# Patient Record
Sex: Female | Born: 1965 | Race: White | Hispanic: No | Marital: Married | Smoking: Former smoker
Health system: Southern US, Community
[De-identification: ages and names within clinical notes are randomized; demographics above are authoritative.]

## PROBLEM LIST (undated history)

## (undated) DIAGNOSIS — N920 Excessive and frequent menstruation with regular cycle: Secondary | ICD-10-CM

## (undated) DIAGNOSIS — R87619 Unspecified abnormal cytological findings in specimens from cervix uteri: Secondary | ICD-10-CM

## (undated) DIAGNOSIS — N946 Dysmenorrhea, unspecified: Secondary | ICD-10-CM

## (undated) DIAGNOSIS — E785 Hyperlipidemia, unspecified: Secondary | ICD-10-CM

## (undated) DIAGNOSIS — E039 Hypothyroidism, unspecified: Secondary | ICD-10-CM

## (undated) HISTORY — DX: Unspecified abnormal cytological findings in specimens from cervix uteri: R87.619

## (undated) HISTORY — DX: Excessive and frequent menstruation with regular cycle: N92.0

## (undated) HISTORY — DX: Hyperlipidemia, unspecified: E78.5

## (undated) HISTORY — DX: Hypothyroidism, unspecified: E03.9

## (undated) HISTORY — DX: Dysmenorrhea, unspecified: N94.6

---

## 2003-11-28 ENCOUNTER — Other Ambulatory Visit: Admission: RE | Admit: 2003-11-28 | Discharge: 2003-11-28 | Payer: Self-pay | Admitting: Gynecology

## 2004-01-10 ENCOUNTER — Ambulatory Visit (HOSPITAL_BASED_OUTPATIENT_CLINIC_OR_DEPARTMENT_OTHER): Admission: RE | Admit: 2004-01-10 | Discharge: 2004-01-10 | Payer: Self-pay | Admitting: Gynecology

## 2004-01-10 ENCOUNTER — Ambulatory Visit (HOSPITAL_COMMUNITY): Admission: RE | Admit: 2004-01-10 | Discharge: 2004-01-10 | Payer: Self-pay | Admitting: Gynecology

## 2004-07-05 HISTORY — PX: TUBAL LIGATION: SHX77

## 2005-07-21 ENCOUNTER — Other Ambulatory Visit: Admission: RE | Admit: 2005-07-21 | Discharge: 2005-07-21 | Payer: Self-pay | Admitting: Gynecology

## 2006-08-01 ENCOUNTER — Other Ambulatory Visit: Admission: RE | Admit: 2006-08-01 | Discharge: 2006-08-01 | Payer: Self-pay | Admitting: Gynecology

## 2008-10-08 ENCOUNTER — Ambulatory Visit: Payer: Self-pay | Admitting: Internal Medicine

## 2008-10-08 DIAGNOSIS — R5383 Other fatigue: Secondary | ICD-10-CM

## 2008-10-08 DIAGNOSIS — E785 Hyperlipidemia, unspecified: Secondary | ICD-10-CM | POA: Insufficient documentation

## 2008-10-08 DIAGNOSIS — E039 Hypothyroidism, unspecified: Secondary | ICD-10-CM

## 2008-10-08 DIAGNOSIS — R5381 Other malaise: Secondary | ICD-10-CM | POA: Insufficient documentation

## 2008-10-08 HISTORY — DX: Hyperlipidemia, unspecified: E78.5

## 2008-10-08 HISTORY — DX: Hypothyroidism, unspecified: E03.9

## 2008-10-08 LAB — CONVERTED CEMR LAB
AST: 23 units/L (ref 0–37)
Albumin: 3.9 g/dL (ref 3.5–5.2)
Alkaline Phosphatase: 58 units/L (ref 39–117)
Basophils Relative: 0.4 % (ref 0.0–3.0)
Bilirubin, Direct: 0.1 mg/dL (ref 0.0–0.3)
CO2: 32 meq/L (ref 19–32)
Calcium: 9.7 mg/dL (ref 8.4–10.5)
Cholesterol, target level: 200 mg/dL
Direct LDL: 132.6 mg/dL
Eosinophils Relative: 1.2 % (ref 0.0–5.0)
GFR calc non Af Amer: 82.63 mL/min (ref >60)
HDL: 62.1 mg/dL (ref >39.00)
Hemoglobin, Urine: NEGATIVE
Hemoglobin: 14.2 g/dL (ref 12.0–15.0)
Ketones, ur: NEGATIVE mg/dL
Lymphocytes Relative: 37.2 % (ref 12.0–46.0)
Monocytes Relative: 7.8 % (ref 3.0–12.0)
Neutro Abs: 3.7 10*3/uL (ref 1.4–7.7)
RBC: 4.48 M/uL (ref 3.87–5.11)
Sodium: 139 meq/L (ref 135–145)
Total CHOL/HDL Ratio: 3
Total Protein, Urine: NEGATIVE mg/dL
Total Protein: 6.7 g/dL (ref 6.0–8.3)
Triglycerides: 62 mg/dL (ref 0.0–149.0)
Urine Glucose: NEGATIVE mg/dL
Urobilinogen, UA: 0.2 (ref 0.0–1.0)
VLDL: 12.4 mg/dL (ref 0.0–40.0)
WBC: 6.9 10*3/uL (ref 4.5–10.5)

## 2008-10-09 ENCOUNTER — Encounter: Payer: Self-pay | Admitting: Internal Medicine

## 2008-11-12 ENCOUNTER — Ambulatory Visit: Payer: Self-pay | Admitting: Internal Medicine

## 2009-04-17 ENCOUNTER — Encounter: Payer: Self-pay | Admitting: Internal Medicine

## 2010-01-29 ENCOUNTER — Ambulatory Visit: Payer: Self-pay | Admitting: Internal Medicine

## 2010-01-29 LAB — CONVERTED CEMR LAB
AST: 20 units/L (ref 0–37)
Alkaline Phosphatase: 61 units/L (ref 39–117)
Basophils Absolute: 0 10*3/uL (ref 0.0–0.1)
Bilirubin, Direct: 0.1 mg/dL (ref 0.0–0.3)
CO2: 27 meq/L (ref 19–32)
Calcium: 8.8 mg/dL (ref 8.4–10.5)
Cholesterol: 201 mg/dL — ABNORMAL HIGH (ref 0–200)
Creatinine, Ser: 0.7 mg/dL (ref 0.4–1.2)
Eosinophils Absolute: 0.1 10*3/uL (ref 0.0–0.7)
GFR calc non Af Amer: 99.09 mL/min (ref >60)
Glucose, Bld: 86 mg/dL (ref 70–99)
HDL: 58.2 mg/dL (ref >39.00)
Hemoglobin: 14.8 g/dL (ref 12.0–15.0)
Lymphocytes Relative: 25.8 % (ref 12.0–46.0)
MCHC: 35.1 g/dL (ref 30.0–36.0)
Monocytes Relative: 9.1 % (ref 3.0–12.0)
Neutro Abs: 5.3 10*3/uL (ref 1.4–7.7)
Neutrophils Relative %: 63.8 % (ref 43.0–77.0)
Platelets: 273 10*3/uL (ref 150.0–400.0)
RDW: 12.1 % (ref 11.5–14.6)
Sodium: 139 meq/L (ref 135–145)
Total Bilirubin: 1 mg/dL (ref 0.3–1.2)
Triglycerides: 59 mg/dL (ref 0.0–149.0)

## 2010-01-30 ENCOUNTER — Encounter: Payer: Self-pay | Admitting: Internal Medicine

## 2010-02-09 ENCOUNTER — Telehealth: Payer: Self-pay | Admitting: Internal Medicine

## 2010-08-04 NOTE — Letter (Signed)
Summary: Lipid Letter  [facility]  [address] [place], [place] [address]   Phone: [phone]  Fax: [phone]    01/30/2010  Melissa Gibson [address] [place], [place]  [address]  Dear Corrie Dandy:  We have carefully reviewed your last lipid profile from  and the results are noted below with a summary of recommendations for lipid management.    Cholesterol:       201     Goal: <200   HDL "good" Cholesterol:   40.10     Goal: >40   LDL "bad" Cholesterol:   125     Goal: <160   Triglycerides:       59.0     Goal: <150        TLC Diet (Therapeutic Lifestyle Change): Saturated Fats & Transfatty acids should be kept < 7% of total calories ***Reduce Saturated Fats Polyunstaurated Fat can be up to 10% of total calories Monounsaturated Fat Fat can be up to 20% of total calories Total Fat should be no greater than 25-35% of total calories Carbohydrates should be 50-60% of total calories Protein should be approximately 15% of total calories Fiber should be at least 20-30 grams a day ***Increased fiber may help lower LDL Total Cholesterol should be < 200mg /day Consider adding plant stanol/sterols to diet (example: Benacol spread) ***A higher intake of unsaturated fat may reduce Triglycerides and Increase HDL    Adjunctive Measures (may lower LIPIDS and reduce risk of Heart Attack) include: Aerobic Exercise (20-30 minutes 3-4 times a week) Limit Alcohol Consumption Weight Reduction Aspirin 75-81 mg a day by mouth (if not allergic or contraindicated) Dietary Fiber 20-30 grams a day by mouth     Current Medications:  None If you have any questions, please call. We appreciate being able to work with you.   Sincerely,    [facility] Etta Grandchild MD

## 2010-08-04 NOTE — Assessment & Plan Note (Signed)
Summary: CPX/ WILL COME FASTING/ NWS  #   Vital Signs:  Patient profile:   45 year old female Height:      64 inches Weight:      162.19 pounds BMI:     27.94 O2 Sat:      97 % on Room air Temp:     98.7 degrees F oral Pulse rate:   79 / minute Pulse rhythm:   regular Resp:     16 per minute BP sitting:   112 / 72  (left arm) Cuff size:   large  Vitals Entered By: Rock Nephew CMA (January 29, 2010 8:49 AM)  Nutrition Counseling: Patient's BMI is greater than 25 and therefore counseled on weight management options.  O2 Flow:  Room air  Primary Care Provider:  Etta Grandchild MD   History of Present Illness: She returns for f/up.  Preventive Screening-Counseling & Management  Alcohol-Tobacco     Alcohol drinks/day: <1     Alcohol type: wine     >5/day in last 3 mos: no     Alcohol Counseling: not indicated; use of alcohol is not excessive or problematic     Feels need to cut down: no     Feels annoyed by complaints: no     Feels guilty re: drinking: no     Needs 'eye opener' in am: no     Smoking Status: quit     Smoke Cessation Stage: quit     Year Started: 1987     Year Quit: 2009     Pack years: 15     Tobacco Counseling: to remain off tobacco products  Hep-HIV-STD-Contraception     Hepatitis Risk: no risk noted     HIV Risk: no risk noted     STD Risk: no risk noted     Sun Exposure-Excessive: no  Current Medications (verified): 1)  None  Allergies (verified): No Known Drug Allergies  Past History:  Past Medical History: Last updated: 10/08/2008 Hyperlipidemia Hypothyroidism  Past Surgical History: Last updated: 10/08/2008 Tubal ligation  Family History: Last updated: 10/08/2008 Family History of CAD Female 1st degree relative <50 Family History High cholesterol Family History Hypertension Family History Lung cancer Family History of Sudden Death  Social History: Last updated: 10/08/2008 Occupation: Airline pilot Married Drug use-no Regular  exercise-yes  Risk Factors: Alcohol Use: <1 (01/29/2010) >5 drinks/d w/in last 3 months: no (01/29/2010) Exercise: yes (10/08/2008)  Risk Factors: Smoking Status: quit (01/29/2010)  Family History: Reviewed history from 10/08/2008 and no changes required. Family History of CAD Female 1st degree relative <50 Family History High cholesterol Family History Hypertension Family History Lung cancer Family History of Sudden Death  Social History: Reviewed history from 10/08/2008 and no changes required. Occupation: Sales Married Drug use-no Regular exercise-yes  Review of Systems       The patient complains of weight gain.  The patient denies anorexia, fever, weight loss, chest pain, syncope, dyspnea on exertion, peripheral edema, prolonged cough, headaches, hemoptysis, abdominal pain, melena, hematochezia, severe indigestion/heartburn, hematuria, suspicious skin lesions, difficulty walking, depression, enlarged lymph nodes, and angioedema.   General:  Complains of fatigue; denies chills, loss of appetite, malaise, sleep disorder, sweats, and weakness. GI:  Complains of constipation; denies abdominal pain, bloody stools, diarrhea, excessive appetite, indigestion, loss of appetite, nausea, vomiting, vomiting blood, and yellowish skin color. Endo:  Complains of weight change; denies cold intolerance, excessive hunger, excessive thirst, excessive urination, heat intolerance, and polyuria.  Physical Exam  General:  alert, well-developed, well-nourished, and well-hydrated.   Head:  Normocephalic and atraumatic without obvious abnormalities. No apparent alopecia or balding. Eyes:  No corneal or conjunctival inflammation noted. EOMI. Perrla. Funduscopic exam benign, without hemorrhages, exudates or papilledema. Vision grossly normal. Mouth:  Oral mucosa and oropharynx without lesions or exudates.  Teeth in good repair. Neck:  supple, full ROM, and no masses.   Lungs:  Normal respiratory  effort, chest expands symmetrically. Lungs are clear to auscultation, no crackles or wheezes. Heart:  Normal rate and regular rhythm. S1 and S2 normal without gallop, murmur, click, rub or other extra sounds. Abdomen:  soft, non-tender, normal bowel sounds, no hepatomegaly, and no splenomegaly.   Msk:  normal ROM, no joint tenderness, no joint swelling, no joint warmth, no redness over joints, no joint deformities, no joint instability, and no crepitation.   Pulses:  R and L carotid,radial,femoral,dorsalis pedis and posterior tibial pulses are full and equal bilaterally Extremities:  No clubbing, cyanosis, edema, or deformity noted with normal full range of motion of all joints.   Neurologic:  No cranial nerve deficits noted. Station and gait are normal. Plantar reflexes are down-going bilaterally. DTRs are symmetrical throughout. Sensory, motor and coordinative functions appear intact. Skin:  turgor normal, color normal, no rashes, no suspicious lesions, no ecchymoses, no petechiae, no purpura, no ulcerations, and no edema.   Cervical Nodes:  No lymphadenopathy noted Axillary Nodes:  No palpable lymphadenopathy Inguinal Nodes:  no R inguinal adenopathy and no L inguinal adenopathy.   Psych:  Oriented X3, memory intact for recent and remote, normally interactive, good eye contact, not anxious appearing, not depressed appearing, and not agitated.     Impression & Recommendations:  Problem # 1:  HYPOTHYROIDISM (ICD-244.9) Assessment Unchanged  Orders: Venipuncture (98119) TLB-Lipid Panel (80061-LIPID) TLB-BMP (Basic Metabolic Panel-BMET) (80048-METABOL) TLB-CBC Platelet - w/Differential (85025-CBCD) TLB-Hepatic/Liver Function Pnl (80076-HEPATIC) TLB-TSH (Thyroid Stimulating Hormone) (84443-TSH)  Problem # 2:  HYPERLIPIDEMIA (ICD-272.4) Assessment: Unchanged  Orders: Venipuncture (14782) TLB-Lipid Panel (80061-LIPID) TLB-BMP (Basic Metabolic Panel-BMET) (80048-METABOL) TLB-CBC Platelet  - w/Differential (85025-CBCD) TLB-Hepatic/Liver Function Pnl (80076-HEPATIC) TLB-TSH (Thyroid Stimulating Hormone) (84443-TSH)  Labs Reviewed: SGOT: 23 (10/08/2008)   SGPT: 24 (10/08/2008)  Lipid Goals: Chol Goal: 200 (10/08/2008)   HDL Goal: 40 (10/08/2008)   LDL Goal: 160 (10/08/2008)   TG Goal: 150 (10/08/2008)  Prior 10 Yr Risk Heart Disease: Not enough information (10/08/2008)   HDL:62.10 (10/08/2008)  Chol:205 (10/08/2008)  Trig:62.0 (10/08/2008)  Problem # 3:  FATIGUE, ACUTE (ICD-780.79) Assessment: Unchanged  Orders: Venipuncture (95621) TLB-Lipid Panel (80061-LIPID) TLB-BMP (Basic Metabolic Panel-BMET) (80048-METABOL) TLB-CBC Platelet - w/Differential (85025-CBCD) TLB-Hepatic/Liver Function Pnl (80076-HEPATIC) TLB-TSH (Thyroid Stimulating Hormone) (84443-TSH)  PAP Screening:    Hx Cervical Dysplasia in last 5 yrs? No    3 normal PAP smears in last 5 yrs? Yes    Last PAP smear:  04/22/2009  PAP Smear Results:    Date of Exam:  04/22/2009    Results:  Normal  Mammogram Screening:    Last Mammogram:  12/25/2009  Mammogram Results:    Date of Exam:  12/25/2009    Results:  Normal Bilateral  Osteoporosis Risk Assessment:  Risk Factors for Fracture or Low Bone Density:   Race (White or Asian):     yes   Smoking status:       quit   Thyroid disease:     yes  Patient Instructions: 1)  Please schedule a follow-up appointment in 2 months. 2)  It is important that you exercise  regularly at least 20 minutes 5 times a week. If you develop chest pain, have severe difficulty breathing, or feel very tired , stop exercising immediately and seek medical attention. 3)  You need to lose weight. Consider a lower calorie diet and regular exercise.  4)  Schedule your mammogram. 5)  You need to have a Pap Smear to prevent cervical cancer.  Preventive Care Screening  Mammogram:    Date:  12/03/2009    Results:  normal     Not Administered:    Tetanus Vaccine not given  due to: declined

## 2010-08-04 NOTE — Progress Notes (Signed)
Summary: RESULTS  Phone Note Call from Patient Call back at Work Phone [phone] Call back at [facility] ext [phone] till 4pm   Summary of Call: Patient is requesting results of thyroid. Ok to inform it was normal?  Initial call taken by: Lamar Sprinkles, CMA,  February 09, 2010 10:45 AM  Follow-up for Phone Call        yes Follow-up by: Etta Grandchild MD,  February 09, 2010 10:49 AM  Additional Follow-up for Phone Call Additional follow up Details #1::        Pt at lunch, will try again later. ............Marland KitchenLamar Sprinkles, CMA  February 09, 2010 12:10 PM   Pt informed  Additional Follow-up by: Lamar Sprinkles, CMA,  February 09, 2010 2:26 PM

## 2010-10-05 DIAGNOSIS — E039 Hypothyroidism, unspecified: Secondary | ICD-10-CM | POA: Insufficient documentation

## 2010-10-05 HISTORY — DX: Hypothyroidism, unspecified: E03.9

## 2010-11-03 HISTORY — PX: NOVASURE ABLATION: SHX5394

## 2010-11-20 NOTE — Op Note (Signed)
NAMESPRUHA, WEIGHT                           ACCOUNT NO.:  [id number]   MEDICAL RECORD NO.:  [id number]                   PATIENT TYPE:  AMB   LOCATION:  [facility]                                 FACILITY:  [facility]   PHYSICIAN:  Gretta Cool, M.D.              DATE OF BIRTH:  03/09/1966   DATE OF PROCEDURE:  DATE OF DISCHARGE:                                 OPERATIVE REPORT   PREOPERATIVE DIAGNOSIS:  Desires sterilization.   POSTOPERATIVE DIAGNOSIS:  Desires sterilization plus minimal endometriosis.   SURGEON:  Gretta Cool, M.D.   ANESTHESIA:  IV sedation and local anesthesia.   DESCRIPTION OF PROCEDURE:  Under excellent anesthesia as above with the  patient prepped and draped in Allen stirrups.  With a Hulka tenaculum  applied to her cervix, a subumbilical incision was made, and Veress cannula  introduced.  After adequate pneumoperitoneum, the laparoscopic trocar was  introduced and pelvic organs visualized.  Nitrous oxide was used for  insufflation.  The fallopian tubes were examined carefully.  The entire  posterior aspect of the uterus was examined.  There was some minimal  adhesion and minimal endometriosis in the depth of the cul de sac.  The  patient had some small amount of menstrual-looking blood in her peritoneal  cavity immediately post menstrual.  There was no involvement of ovaries,  tubes, or any other than tiny implants in the depth of the pelvis.  At this  point, the Filshie clips were loaded and then applied across the entire  lumen of fallopian tube on both sides, approximately 1 cm or so distal to  the uterine insertion of the fallopian tube.  At this point, the fallopian  tubes were re-anesthetized topically with Marcaine 0.5%.  The port sites  were also infiltrated heavily with Marcaine 0.5%.  The incision was then  closed with a deep suture of 0 Vicryl at the 10 mm port site using UR6  needle.  The skin incision was then closed with subcuticular 5-0  Vicryl and  Steri-Strips.  At the end of the procedure, sponge and lap counts were  correct and no complications.  The patient returned to the recovery room in  excellent condition.                                               Gretta Cool, M.D.    CWL/MEDQ  D:  01/10/2004  T:  01/10/2004  Job:  [id number]

## 2011-03-16 ENCOUNTER — Encounter: Payer: Self-pay | Admitting: Internal Medicine

## 2011-03-16 ENCOUNTER — Encounter: Payer: Self-pay | Admitting: *Deleted

## 2011-03-18 ENCOUNTER — Ambulatory Visit: Payer: Self-pay | Admitting: Internal Medicine

## 2011-03-26 ENCOUNTER — Encounter: Payer: Self-pay | Admitting: Internal Medicine

## 2011-03-26 ENCOUNTER — Ambulatory Visit (INDEPENDENT_AMBULATORY_CARE_PROVIDER_SITE_OTHER)
Admission: RE | Admit: 2011-03-26 | Discharge: 2011-03-26 | Disposition: A | Discharge status: Home / Self Care | Payer: BC Managed Care – PPO | Source: Ambulatory Visit | Attending: Internal Medicine | Admitting: Internal Medicine

## 2011-03-26 ENCOUNTER — Ambulatory Visit (INDEPENDENT_AMBULATORY_CARE_PROVIDER_SITE_OTHER): Payer: BC Managed Care – PPO | Admitting: Internal Medicine

## 2011-03-26 VITALS — BP 128/84 | HR 73 | Temp 98.8°F | Resp 16 | Wt 167.5 lb

## 2011-03-26 DIAGNOSIS — M25512 Pain in left shoulder: Secondary | ICD-10-CM | POA: Insufficient documentation

## 2011-03-26 DIAGNOSIS — M67919 Unspecified disorder of synovium and tendon, unspecified shoulder: Secondary | ICD-10-CM

## 2011-03-26 DIAGNOSIS — M25519 Pain in unspecified shoulder: Secondary | ICD-10-CM

## 2011-03-26 DIAGNOSIS — M7580 Other shoulder lesions, unspecified shoulder: Secondary | ICD-10-CM | POA: Insufficient documentation

## 2011-03-26 MED ORDER — CELECOXIB 200 MG PO CAPS: 200.0000 mg | ORAL_CAPSULE | Freq: Every day | ORAL | Status: DC

## 2011-03-26 NOTE — Patient Instructions (Signed)
Rotator Cuff Tendonitis (Tendinitis, Tenosynovitis) The rotator cuff is the collection of all the muscles and tendons (the supraspinatus, infraspinatus, subscapularis, and teres minor muscles and their tendons) that help your shoulder stay in place. This unit holds the head of the upper arm bone (humerus) in the cup (fossa) of the shoulder blade (scapula). Basically, it connects the arm to the shoulder. Tendinitis is a swelling and irritation of the tissue, called cord like structures (tendons) that connect muscle to bone. It usually is caused by overusing the joint involved. When the tissue surrounding a tendon (the synovium) becomes inflamed, it is called tenosynovitis. This also is often the result of overuse in people whose jobs require repetitive (over and over again) types of motion. HOME CARE INSTRUCTIONS  Use a sling or splint for   until the pain decreases, or as suggested by your caregiver.   Apply ice to the injury for 20 minutes, 2 times per day. Put the ice in a plastic bag and place a towel between the bag of ice and your skin.   Try to avoid use other than gentle range of motion while your shoulder is painful. Use and exercise only as directed by your caregiver. Stop exercises or range of motion if pain or discomfort increases, unless directed otherwise by your caregiver.   Only take over-the-counter or prescription medicines for pain, discomfort, or fever as directed by your caregiver.   If you were give a shoulder sling and straps (immobilizer), do not remove it except as directed, or until you see a caregiver for a follow-up examination. If you need to remove it, move your arm as little as possible or as directed.   You may want to sleep on several pillows at night to lessen swelling and pain.  SEEK IMMEDIATE MEDICAL CARE IF:  Pain in your shoulder increases or new pain develops in your arm, hand, or fingers and is not relieved with medications.   You develop new, unexplained  symptoms, especially increased numbness in the hands or loss of strength, or you develop any worsening of the problems which brought you in for care.   Your arm, hand, or fingers are numb or tingling.   Your arm, hand, or fingers are swollen, painful, or turn white or blue.  Document Released: 09/11/2003 Document Re-Released: 09/17/2008 [facility] Patient Information 2011 [place], [place].

## 2011-03-26 NOTE — Assessment & Plan Note (Signed)
Ice, rest, celebres, pt ed, consider PT

## 2011-03-26 NOTE — Assessment & Plan Note (Signed)
Check a plain xray to look for spurring, djd, etc.

## 2011-03-26 NOTE — Progress Notes (Signed)
  Subjective:    Patient ID: Melissa Gibson, female    DOB: 11-05-1965, 45 y.o.   MRN: [id number]  Shoulder Pain  The pain is present in the left shoulder. This is a new problem. The current episode started 1 to 4 weeks ago. There has been no history of extremity trauma. The problem occurs intermittently. The problem has been gradually worsening. The quality of the pain is described as sharp. The pain is at a severity of 3/10. The pain is mild. Pertinent negatives include no joint locking, joint swelling, limited range of motion, numbness, stiffness or tingling. The symptoms are aggravated by activity. She has tried acetaminophen for the symptoms. The treatment provided mild relief. There is no history of diabetes, gout, osteoarthritis or rheumatoid arthritis.      Review of Systems  Constitutional: Negative.   HENT: Negative for neck pain and neck stiffness.   Eyes: Negative.   Respiratory: Negative.   Cardiovascular: Negative.   Gastrointestinal: Negative.   Genitourinary: Negative.   Musculoskeletal: Positive for arthralgias. Negative for myalgias, back pain, joint swelling, gait problem, stiffness and gout.  Skin: Negative.   Neurological: Negative.  Negative for tingling and numbness.  Hematological: Negative.   Psychiatric/Behavioral: Negative.        Objective:   Physical Exam  Vitals reviewed. Constitutional: She appears well-developed and well-nourished. No distress.  HENT:  Mouth/Throat: Oropharynx is clear and moist. No oropharyngeal exudate.  Eyes: Conjunctivae are normal. Right eye exhibits no discharge. Left eye exhibits no discharge. No scleral icterus.  Neck: Normal range of motion. Neck supple. No JVD present. No tracheal deviation present. No thyromegaly present.  Cardiovascular: Normal rate, regular rhythm, normal heart sounds and intact distal pulses.  Exam reveals no gallop and no friction rub.   No murmur heard. Pulmonary/Chest: Effort normal and breath sounds  normal. No stridor. No respiratory distress. She has no wheezes. She has no rales. She exhibits no tenderness.  Abdominal: Soft. Bowel sounds are normal. She exhibits no distension. There is no tenderness. There is no rebound and no guarding.  Musculoskeletal:       Left shoulder: She exhibits tenderness (over the posterior rotator cuff area). She exhibits normal range of motion, no bony tenderness, no swelling, no effusion, no crepitus, no deformity, no laceration, no pain, no spasm, normal pulse and normal strength.  Lymphadenopathy:    She has no cervical adenopathy.  Skin: She is not diaphoretic.      Lab Results  Component Value Date   WBC 8.3 01/29/2010   HGB 14.8 01/29/2010   HCT 42.2 01/29/2010   PLT 273.0 01/29/2010   CHOL 201* 01/29/2010   TRIG 59.0 01/29/2010   HDL 58.20 01/29/2010   LDLDIRECT 125.0 01/29/2010   ALT 27 01/29/2010   AST 20 01/29/2010   NA 139 01/29/2010   K 4.9 01/29/2010   CL 98 01/29/2010   CREATININE 0.7 01/29/2010   BUN 18 01/29/2010   CO2 27 01/29/2010   TSH 4.20 01/29/2010      Assessment & Plan:

## 2011-06-04 ENCOUNTER — Other Ambulatory Visit (INDEPENDENT_AMBULATORY_CARE_PROVIDER_SITE_OTHER): Payer: BC Managed Care – PPO

## 2011-06-04 ENCOUNTER — Encounter: Payer: Self-pay | Admitting: Internal Medicine

## 2011-06-04 ENCOUNTER — Ambulatory Visit (INDEPENDENT_AMBULATORY_CARE_PROVIDER_SITE_OTHER): Payer: BC Managed Care – PPO | Admitting: Internal Medicine

## 2011-06-04 DIAGNOSIS — M25512 Pain in left shoulder: Secondary | ICD-10-CM

## 2011-06-04 DIAGNOSIS — E039 Hypothyroidism, unspecified: Secondary | ICD-10-CM

## 2011-06-04 DIAGNOSIS — M67919 Unspecified disorder of synovium and tendon, unspecified shoulder: Secondary | ICD-10-CM

## 2011-06-04 DIAGNOSIS — M25519 Pain in unspecified shoulder: Secondary | ICD-10-CM

## 2011-06-04 DIAGNOSIS — M7580 Other shoulder lesions, unspecified shoulder: Secondary | ICD-10-CM

## 2011-06-04 LAB — TSH: TSH: 1.49 u[IU]/mL (ref 0.35–5.50)

## 2011-06-04 NOTE — Patient Instructions (Signed)
Hypothyroidism The thyroid is a large gland located in the lower front of your neck. The thyroid gland helps control metabolism. Metabolism is how your body handles food. It controls metabolism with the hormone thyroxine. When this gland is underactive (hypothyroid), it produces too little hormone.  CAUSES These include:   Absence or destruction of thyroid tissue.   Goiter due to iodine deficiency.   Goiter due to medications.   Congenital defects (since birth).   Problems with the pituitary. This causes a lack of TSH (thyroid stimulating hormone). This hormone tells the thyroid to turn out more hormone.  SYMPTOMS  Lethargy (feeling as though you have no energy)   Cold intolerance   Weight gain (in spite of normal food intake)   Dry skin   Coarse hair   Menstrual irregularity (if severe, may lead to infertility)   Slowing of thought processes  Cardiac problems are also caused by insufficient amounts of thyroid hormone. Hypothyroidism in the newborn is cretinism, and is an extreme form. It is important that this form be treated adequately and immediately or it will lead rapidly to retarded physical and mental development. DIAGNOSIS  To prove hypothyroidism, your caregiver may do blood tests and ultrasound tests. Sometimes the signs are hidden. It may be necessary for your caregiver to watch this illness with blood tests either before or after diagnosis and treatment. TREATMENT  Low levels of thyroid hormone are increased by using synthetic thyroid hormone. This is a safe, effective treatment. It usually takes about four weeks to gain the full effects of the medication. After you have the full effect of the medication, it will generally take another four weeks for problems to leave. Your caregiver may start you on low doses. If you have had heart problems the dose may be gradually increased. It is generally not an emergency to get rapidly to normal. HOME CARE INSTRUCTIONS   Take  your medications as your caregiver suggests. Let your caregiver know of any medications you are taking or start taking. Your caregiver will help you with dosage schedules.   As your condition improves, your dosage needs may increase. It will be necessary to have continuing blood tests as suggested by your caregiver.   Report all suspected medication side effects to your caregiver.  SEEK MEDICAL CARE IF: Seek medical care if you develop:  Sweating.   Tremulousness (tremors).   Anxiety.   Rapid weight loss.   Heat intolerance.   Emotional swings.   Diarrhea.   Weakness.  SEEK IMMEDIATE MEDICAL CARE IF:  You develop chest pain, an irregular heart beat (palpitations), or a rapid heart beat. MAKE SURE YOU:   Understand these instructions.   Will watch your condition.   Will get help right away if you are not doing well or get worse.  Document Released: 06/21/2005 Document Revised: 03/03/2011 Document Reviewed: 02/09/2008 [facility] Patient Information 2012 [place], [place].Rotator Cuff Tendonitis  The rotator cuff is the collection of all the muscles and tendons (the supraspinatus, infraspinatus, subscapularis, and teres minor muscles and their tendons) that help your shoulder stay in place. This unit holds the head of the upper arm bone (humerus) in the cup (fossa) of the shoulder blade (scapula). Basically, it connects the arm to the shoulder. Tendinitis is a swelling and irritation of the tissue, called cord like structures (tendons) that connect muscle to bone. It usually is caused by overusing the joint involved. When the tissue surrounding a tendon (the synovium) becomes inflamed, it is called tenosynovitis.  This also is often the result of overuse in people whose jobs require repetitive (over and over again) types of motion. HOME CARE INSTRUCTIONS   Use a sling or splint for as long as directed by your caregiver until the pain decreases.   Apply ice to the injury for 15 to 20  minutes, 3 to 4 times per day. Put the ice in a plastic bag and place a towel between the bag of ice and your skin.   Try to avoid use other than gentle range of motion while your shoulder is painful. Use and exercise only as directed by your caregiver. Stop exercises or range of motion if pain or discomfort increases, unless directed otherwise by your caregiver.   Only take over-the-counter or prescription medicines for pain, discomfort, or fever as directed by your caregiver.   If you were give a shoulder sling and straps (immobilizer), do not remove it except as directed, or until you see a caregiver for a follow-up examination. If you need to remove it, move your arm as little as possible or as directed.   You may want to sleep on several pillows at night to lessen swelling and pain.  SEEK IMMEDIATE MEDICAL CARE IF:   Pain in your shoulder increases or new pain develops in your arm, hand, or fingers and is not relieved with medications.   You develop new, unexplained symptoms, especially increased numbness in the hands or loss of strength, or you develop any worsening of the problems which brought you in for care.   Your arm, hand, or fingers are numb or tingling.   Your arm, hand, or fingers are swollen, painful, or turn white or blue.  Document Released: 09/11/2003 Document Revised: 03/03/2011 Document Reviewed: 04/18/2008 [facility] Patient Information 2012 [place], [place].

## 2011-06-04 NOTE — Progress Notes (Deleted)
Subjective:    Patient ID: Melissa Gibson, female    DOB: 1966-05-19, 45 y.o.   MRN: [id number]  Cough This is a new problem. Episode onset: 10 days. The problem has been gradually worsening. The problem occurs every few minutes. The cough is productive of purulent sputum. Associated symptoms include chills and a sore throat. Pertinent negatives include no chest pain, ear congestion, ear pain, eye redness, fever, headaches, heartburn, hemoptysis, myalgias, nasal congestion, postnasal drip, rash, rhinorrhea, shortness of breath, sweats, weight loss or wheezing. The symptoms are aggravated by nothing. She has tried OTC cough suppressant for the symptoms. The treatment provided no relief.  Neck Pain  This is a recurrent problem. The current episode started more than 1 month ago. The problem occurs intermittently. The problem has been gradually worsening. The pain is associated with nothing. The pain is present in the right side and midline. The quality of the pain is described as aching and shooting. The pain is at a severity of 2/10. The pain is mild. The symptoms are aggravated by position. Stiffness is present all day. Pertinent negatives include no chest pain, fever, headaches, leg pain, numbness, pain with swallowing, paresis, photophobia, syncope, tingling, trouble swallowing, visual change, weakness or weight loss. She has tried nothing for the symptoms.      Review of Systems  Constitutional: Positive for chills. Negative for fever, weight loss, diaphoresis, activity change, appetite change, fatigue and unexpected weight change.  HENT: Positive for sore throat, neck pain and neck stiffness. Negative for ear pain, congestion, facial swelling, rhinorrhea, sneezing, trouble swallowing, dental problem, voice change, postnasal drip and sinus pressure.   Eyes: Negative for photophobia, pain, discharge, redness, itching and visual disturbance.  Respiratory: Positive for cough. Negative for apnea,  hemoptysis, choking, chest tightness, shortness of breath, wheezing and stridor.   Cardiovascular: Negative for chest pain, palpitations, leg swelling and syncope.  Gastrointestinal: Negative for heartburn, nausea, vomiting, abdominal pain, diarrhea, constipation, blood in stool and abdominal distention.  Genitourinary: Negative for dysuria, urgency, frequency, hematuria, flank pain, decreased urine volume, enuresis, difficulty urinating and dyspareunia.  Musculoskeletal: Negative for myalgias, back pain, joint swelling, arthralgias and gait problem.  Skin: Negative for color change, pallor, rash and wound.  Neurological: Negative for dizziness, tingling, tremors, seizures, syncope, facial asymmetry, speech difficulty, weakness, light-headedness, numbness and headaches.  Hematological: Negative for adenopathy. Does not bruise/bleed easily.  Psychiatric/Behavioral: Negative.        Objective:   Physical Exam  Vitals reviewed. Constitutional: She is oriented to person, place, and time. She appears well-developed and well-nourished. No distress.  HENT:  Head: Normocephalic and atraumatic.  Mouth/Throat: Oropharynx is clear and moist. No oropharyngeal exudate.  Eyes: Conjunctivae are normal. Right eye exhibits no discharge. Left eye exhibits no discharge. No scleral icterus.  Neck: Normal range of motion. Neck supple. No JVD present. No tracheal deviation present. No thyromegaly present.  Cardiovascular: Normal rate, regular rhythm, normal heart sounds and intact distal pulses.  Exam reveals no gallop and no friction rub.   No murmur heard. Pulmonary/Chest: Effort normal and breath sounds normal. No stridor. No respiratory distress. She has no wheezes. She has no rales. She exhibits no tenderness.  Abdominal: Soft. Bowel sounds are normal. She exhibits no distension and no mass. There is no tenderness. There is no rebound and no guarding.  Musculoskeletal: Normal range of motion. She exhibits no  edema.       Cervical back: Normal. She exhibits normal range of motion, no tenderness, no  bony tenderness, no swelling, no edema, no deformity, no laceration, no pain, no spasm and normal pulse.  Lymphadenopathy:    She has no cervical adenopathy.  Neurological: She is alert and oriented to person, place, and time. She has normal strength. She displays no atrophy, no tremor and normal reflexes. No cranial nerve deficit or sensory deficit. She exhibits normal muscle tone. She displays a negative Romberg sign. She displays no seizure activity. Coordination and gait normal. She displays no Babinski's sign on the right side. She displays no Babinski's sign on the left side.  Reflex Scores:      Tricep reflexes are 1+ on the right side and 1+ on the left side.      Bicep reflexes are 1+ on the right side and 1+ on the left side.      Brachioradialis reflexes are 1+ on the right side.      Patellar reflexes are 1+ on the right side and 1+ on the left side.      Achilles reflexes are 1+ on the right side and 1+ on the left side. Skin: Skin is warm and dry. No rash noted. She is not diaphoretic. No erythema. No pallor.  Psychiatric: She has a normal mood and affect. Her behavior is normal. Judgment and thought content normal.      Lab Results  Component Value Date   WBC 8.3 01/29/2010   HGB 14.8 01/29/2010   HCT 42.2 01/29/2010   PLT 273.0 01/29/2010   GLUCOSE 86 01/29/2010   CHOL 201* 01/29/2010   TRIG 59.0 01/29/2010   HDL 58.20 01/29/2010   LDLDIRECT 125.0 01/29/2010   ALT 27 01/29/2010   AST 20 01/29/2010   NA 139 01/29/2010   K 4.9 01/29/2010   CL 98 01/29/2010   CREATININE 0.7 01/29/2010   BUN 18 01/29/2010   CO2 27 01/29/2010   TSH 4.20 01/29/2010      Assessment & Plan:

## 2011-06-06 ENCOUNTER — Encounter: Payer: Self-pay | Admitting: Internal Medicine

## 2011-06-06 NOTE — Assessment & Plan Note (Signed)
Ortho referral, I question the need for an MRI, steroid injection, etc.

## 2011-06-06 NOTE — Progress Notes (Signed)
  Subjective:    Patient ID: Melissa Gibson, female    DOB: Apr 15, 1966, 45 y.o.   MRN: [id number]  Thyroid Problem Presents for follow-up visit. Patient reports no anxiety, cold intolerance, constipation, depressed mood, diaphoresis, diarrhea, dry skin, fatigue, hair loss, heat intolerance, hoarse voice, leg swelling, menstrual problem, nail problem, palpitations, tremors, visual change, weight gain or weight loss. The symptoms have been stable.      Review of Systems  Constitutional: Negative for fever, chills, weight loss, weight gain, diaphoresis, activity change, appetite change, fatigue and unexpected weight change.  HENT: Negative.  Negative for hoarse voice.   Eyes: Negative.   Respiratory: Negative for apnea, cough, choking, shortness of breath, wheezing and stridor.   Cardiovascular: Negative for chest pain, palpitations and leg swelling.  Gastrointestinal: Negative for nausea, vomiting, abdominal pain, diarrhea, constipation and blood in stool.  Genitourinary: Negative.  Negative for menstrual problem.  Musculoskeletal: Positive for arthralgias (worsening left shoulder pain). Negative for myalgias, back pain, joint swelling and gait problem.  Skin: Negative for color change, pallor, rash and wound.  Neurological: Negative for dizziness, tremors, seizures, syncope, facial asymmetry, speech difficulty, weakness, light-headedness, numbness and headaches.  Hematological: Negative for cold intolerance, heat intolerance and adenopathy. Does not bruise/bleed easily.  Psychiatric/Behavioral: Negative.        Objective:   Physical Exam  Vitals reviewed. Constitutional: She is oriented to person, place, and time. She appears well-developed and well-nourished. No distress.  HENT:  Head: Normocephalic and atraumatic.  Mouth/Throat: Oropharynx is clear and moist. No oropharyngeal exudate.  Eyes: Conjunctivae are normal. Right eye exhibits no discharge. Left eye exhibits no discharge. No  scleral icterus.  Neck: Normal range of motion. Neck supple. No JVD present. No tracheal deviation present. No thyromegaly present.  Cardiovascular: Normal rate, regular rhythm, normal heart sounds and intact distal pulses.  Exam reveals no gallop and no friction rub.   No murmur heard. Pulmonary/Chest: Effort normal and breath sounds normal. No stridor. No respiratory distress. She has no wheezes. She has no rales. She exhibits no tenderness.  Abdominal: Soft. Bowel sounds are normal. She exhibits no distension and no mass. There is no tenderness. There is no rebound and no guarding.  Musculoskeletal: She exhibits no edema and no tenderness.       Left shoulder: She exhibits decreased range of motion. She exhibits no tenderness, no bony tenderness, no swelling, no effusion, no crepitus, no deformity, no laceration, no pain, no spasm, normal pulse and normal strength.  Lymphadenopathy:    She has no cervical adenopathy.  Neurological: She is oriented to person, place, and time.  Skin: Skin is warm and dry. No rash noted. She is not diaphoretic. No erythema. No pallor.  Psychiatric: She has a normal mood and affect. Her behavior is normal. Judgment and thought content normal.      Lab Results  Component Value Date   WBC 8.3 01/29/2010   HGB 14.8 01/29/2010   HCT 42.2 01/29/2010   PLT 273.0 01/29/2010   GLUCOSE 86 01/29/2010   CHOL 201* 01/29/2010   TRIG 59.0 01/29/2010   HDL 58.20 01/29/2010   LDLDIRECT 125.0 01/29/2010   ALT 27 01/29/2010   AST 20 01/29/2010   NA 139 01/29/2010   K 4.9 01/29/2010   CL 98 01/29/2010   CREATININE 0.7 01/29/2010   BUN 18 01/29/2010   CO2 27 01/29/2010   TSH 1.49 06/04/2011     Assessment & Plan:

## 2011-06-06 NOTE — Assessment & Plan Note (Signed)
Her TSH is on the normal range 

## 2011-06-06 NOTE — Assessment & Plan Note (Signed)
Continue celebrex.  

## 2011-09-30 ENCOUNTER — Ambulatory Visit (INDEPENDENT_AMBULATORY_CARE_PROVIDER_SITE_OTHER): Payer: BC Managed Care – PPO | Admitting: Physician Assistant

## 2011-09-30 VITALS — BP 128/77 | HR 79 | Temp 98.8°F | Resp 16 | Ht 64.5 in | Wt 176.6 lb

## 2011-09-30 DIAGNOSIS — J039 Acute tonsillitis, unspecified: Secondary | ICD-10-CM

## 2011-09-30 MED ORDER — AMOXICILLIN 875 MG PO TABS: 875.0000 mg | ORAL_TABLET | Freq: Two times a day (BID) | ORAL | Status: AC

## 2011-09-30 MED ORDER — AMOXICILLIN 875 MG PO TABS: 875.0000 mg | ORAL_TABLET | Freq: Two times a day (BID) | ORAL | Status: DC

## 2011-09-30 MED ORDER — TRAMADOL HCL 50 MG PO TABS: 50.0000 mg | ORAL_TABLET | Freq: Three times a day (TID) | ORAL | Status: DC | PRN

## 2011-09-30 MED ORDER — TRAMADOL HCL 50 MG PO TABS: 50.0000 mg | ORAL_TABLET | Freq: Three times a day (TID) | ORAL | Status: AC | PRN

## 2011-09-30 NOTE — Progress Notes (Signed)
  Subjective:    Patient ID: Melissa Gibson, female    DOB: 1965/09/08, 46 y.o.   MRN: [id number]  HPI  Melissa Gibson comes in today with a ST that started yesterday and has worsened over night.  She has no other URI sx.  No fever or chills.  Review of Systems As above    Objective:   Physical Exam  Constitutional: She is oriented to person, place, and time. Vital signs are normal.  HENT:  Right Ear: Tympanic membrane normal.  Left Ear: Tympanic membrane normal.  Mouth/Throat: Uvula is midline and mucous membranes are normal. Oropharyngeal exudate, posterior oropharyngeal edema and posterior oropharyngeal erythema present.  Cardiovascular: Normal rate and regular rhythm.   Pulmonary/Chest: Effort normal and breath sounds normal.  Lymphadenopathy:    She has no cervical adenopathy.  Neurological: She is alert and oriented to person, place, and time.  Skin: Skin is warm and dry.    Results for orders placed in visit on 09/30/11  POCT RAPID STREP A (OFFICE)      Component Value Range   Rapid Strep A Screen Negative  Negative          Assessment & Plan:  Tonsillitis  Amoxicillin, [facility] MW and Tramadol Culture

## 2011-12-20 ENCOUNTER — Other Ambulatory Visit: Payer: Self-pay | Admitting: Internal Medicine

## 2011-12-20 DIAGNOSIS — Z1231 Encounter for screening mammogram for malignant neoplasm of breast: Secondary | ICD-10-CM

## 2011-12-27 ENCOUNTER — Ambulatory Visit (INDEPENDENT_AMBULATORY_CARE_PROVIDER_SITE_OTHER): Payer: BC Managed Care – PPO | Admitting: Internal Medicine

## 2011-12-27 ENCOUNTER — Encounter: Payer: Self-pay | Admitting: Internal Medicine

## 2011-12-27 VITALS — BP 140/82 | HR 84 | Temp 98.4°F | Resp 16 | Wt 170.0 lb

## 2011-12-27 DIAGNOSIS — L819 Disorder of pigmentation, unspecified: Secondary | ICD-10-CM

## 2011-12-27 DIAGNOSIS — L811 Chloasma: Secondary | ICD-10-CM

## 2011-12-27 DIAGNOSIS — M7710 Lateral epicondylitis, unspecified elbow: Secondary | ICD-10-CM

## 2011-12-27 HISTORY — DX: Chloasma: L81.1

## 2011-12-27 HISTORY — DX: Lateral epicondylitis, unspecified elbow: M77.10

## 2011-12-27 MED ORDER — NAPROXEN SODIUM ER 750 MG PO TB24: 1.0000 | ORAL_TABLET | Freq: Every day | ORAL | Status: DC

## 2011-12-27 MED ORDER — HYDROQUINONE 4 % EX CREA: TOPICAL_CREAM | Freq: Two times a day (BID) | CUTANEOUS | Status: AC

## 2011-12-27 NOTE — Patient Instructions (Signed)
Melasma  Melasma is a skin condition. It does not spread from person to person (non-contagious). Melasma is an area of tan or brown coloring that usually appears on the cheeks, forehead, upper lip and neck. These patches can look like a mask. The discolored area do not itch and are not red or swollen. This usually occurs in women with skin that colors (pigments) easily and also women of light brown skin color. It often occurs with pregnancy, menopausal women on hormone replacement, with liver disease or taking oral contraceptives especially when followed by sun exposure. It can also occur in men and nonpregnant women. It is more common in tropical climates.  CAUSES Increase in pigment producing cells (melanocytes) in the skin.  Becoming pregnant.  Women on oral contraceptives.  Menopausal women on hormone replacement treatment.  Increase exposure to the sun in women with light brown skin.  It can be hereditary.  Allergies to medications or cosmetics.  Thyroid disease.  Addison's disease (loss of function of the adrenal gland).  Excessive stress.  SYMPTOMS  There are no symptoms except for the discolored skin with dark or tan blotches. DIAGNOSIS  Melasma is diagnosed based on its physical appearance.  Wood's lamp to examine the discolored skin.  PREVENTION  To lower the risk of melasma, a woman can avoid oral contraceptives, hormone replacement treatment in menopause and stay out of the sun.  PROGNOSIS  There are no long-term effects from melasma. Using strong sun block may help.  TREATMENT  Bleaching creams, skin care products, peels that contain glycolic acid, skin peels and sunscreens that extend into the UVA blocking range are all helpful in the treatment of melasma.  The darkened skin of melasma usually fades somewhat after a woman gives birth or stops using oral contraceptives.  Specific products used to treat the melasma may have side effects. Some people may have a mild allergic  reaction to the cream or bleach.  Using a combination of hydroquinone and glycolic acid, prescription or over-the-counter medicines. Follow the advice of your caregiver.  Tretinoin. This should not be used when pregnant.  Azelaic acid 20%.  Facial peels with alpha hydroxy acids or chemical peels.  Laser treatment.  Cosmetic cover-ups are available.  Avoid sunlight during all of these treatments. HOME CARE INSTRUCTIONS  Problems which are getting worse should be reported to your caregiver.  Avoid overexposure to the sun, especially in tropical areas.  Use a strong sun block cream when you are in the sun.  SEEK MEDICAL CARE IF:  You develop skin blotches and want to get them checked out.  You want to know what kind of treatment you can use for the skin blotches.  If you have skin blotches and they are getting worse with or without treatment.  You notice bleeding from the skin blotches.  Document Released: 09/11/2002 Document Revised: 06/10/2011 Document Reviewed: 04/17/2008 [facility] Patient Information 2012 [place], [place].Lateral Epicondylitis (Tennis Elbow) with Rehab Lateral epicondylitis involves inflammation and pain around the outer portion of the elbow. The pain is caused by inflammation of the tendons in the forearm that bring back (extend) the wrist. Lateral epicondylittis is also called tennis elbow, because it is very common in tennis players. However, it may occur in any individual who extends the wrist repetitively. If lateral epicondylitis is left untreated, it may become a chronic problem. SYMPTOMS   Pain, tenderness, and inflammation on the outer (lateral) side of the elbow.   Pain or weakness with gripping activities.  Pain that increases with wrist twisting motions (playing tennis, using a screwdriver, opening a door or a jar).   Pain with lifting objects, including a coffee cup.  CAUSES  Lateral epicondylitis is caused by inflammation of the tendons that extend the  wrist. Causes of injury may include:  Repetitive stress and strain on the muscles and tendons that extend the wrist.   Sudden change in activity level or intensity.   Incorrect grip in racquet sports.   Incorrect grip size of racquet (often too large).   Incorrect hitting position or technique (usually backhand, leading with the elbow).   Using a racket that is too heavy.  RISK INCREASES WITH:  Sports or occupations that require repetitive and/or strenuous forearm and wrist movements (tennis, squash, racquetball, carpentry).   Poor wrist and forearm strength and flexibility.   Failure to warm up properly before activity.   Resuming activity before healing, rehabilitation, and conditioning are complete.  PREVENTION   Warm up and stretch properly before activity.   Maintain physical fitness:   Strength, flexibility, and endurance.   Cardiovascular fitness.   Wear and use properly fitted equipment.   Learn and use proper technique and have a coach correct improper technique.   Wear a tennis elbow (counterforce) brace.  PROGNOSIS  The course of this condition depends on the degree of the injury. If treated properly, acute cases (symptoms lasting less than 4 weeks) are often resolved in 2 to 6 weeks. Chronic (longer lasting cases) often resolve in 3 to 6 months, but may require physical therapy. RELATED COMPLICATIONS   Frequently recurring symptoms, resulting in a chronic problem. Properly treating the problem the first time decreases frequency of recurrence.   Chronic inflammation, scarring tendon degeneration, and partial tendon tear, requiring surgery.   Delayed healing or resolution of symptoms.  TREATMENT  Treatment first involves the use of ice and medicine, to reduce pain and inflammation. Strengthening and stretching exercises may help reduce discomfort, if performed regularly. These exercises may be performed at home, if the condition is an acute injury. Chronic  cases may require a referral to a physical therapist for evaluation and treatment. Your caregiver may advise a corticosteroid injection, to help reduce inflammation. Rarely, surgery is needed. MEDICATION  If pain medicine is needed, nonsteroidal anti-inflammatory medicines (aspirin and ibuprofen), or other minor pain relievers (acetaminophen), are often advised.   Do not take pain medicine for 7 days before surgery.   Prescription pain relievers may be given, if your caregiver thinks they are needed. Use only as directed and only as much as you need.   Corticosteroid injections may be recommended. These injections should be reserved only for the most severe cases, because they can only be given a certain number of times.  HEAT AND COLD  Cold treatment (icing) should be applied for 10 to 15 minutes every 2 to 3 hours for inflammation and pain, and immediately after activity that aggravates your symptoms. Use ice packs or an ice massage.   Heat treatment may be used before performing stretching and strengthening activities prescribed by your caregiver, physical therapist, or athletic trainer. Use a heat pack or a warm water soak.  SEEK MEDICAL CARE IF: Symptoms get worse or do not improve in 2 weeks, despite treatment. EXERCISES  RANGE OF MOTION (ROM) AND STRETCHING EXERCISES - Epicondylitis, Lateral (Tennis Elbow) These exercises may help you when beginning to rehabilitate your injury. Your symptoms may go away with or without further involvement from your physician, physical  therapist or athletic trainer. While completing these exercises, remember:   Restoring tissue flexibility helps normal motion to return to the joints. This allows healthier, less painful movement and activity.   An effective stretch should be held for at least 30 seconds.   A stretch should never be painful. You should only feel a gentle lengthening or release in the stretched tissue.  RANGE OF MOTION - Wrist Flexion,  Active-Assisted  Extend your right / left elbow with your fingers pointing down.*   Gently pull the back of your hand towards you, until you feel a gentle stretch on the top of your forearm.   Hold this position for __________ seconds.  Repeat __________ times. Complete this exercise __________ times per day.  *If directed by your physician, physical therapist or athletic trainer, complete this stretch with your elbow bent, rather than extended. RANGE OF MOTION - Wrist Extension, Active-Assisted  Extend your right / left elbow and turn your palm upwards.*   Gently pull your palm and fingertips back, so your wrist extends and your fingers point more toward the ground.   You should feel a gentle stretch on the inside of your forearm.   Hold this position for __________ seconds.  Repeat __________ times. Complete this exercise __________ times per day. *If directed by your physician, physical therapist or athletic trainer, complete this stretch with your elbow bent, rather than extended. STRETCH - Wrist Flexion  Place the back of your right / left hand on a tabletop, leaving your elbow slightly bent. Your fingers should point away from your body.   Gently press the back of your hand down onto the table by straightening your elbow. You should feel a stretch on the top of your forearm.   Hold this position for __________ seconds.  Repeat __________ times. Complete this stretch __________ times per day.  STRETCH - Wrist Extension   Place your right / left fingertips on a tabletop, leaving your elbow slightly bent. Your fingers should point backwards.   Gently press your fingers and palm down onto the table by straightening your elbow. You should feel a stretch on the inside of your forearm.   Hold this position for __________ seconds.  Repeat __________ times. Complete this stretch __________ times per day.  STRENGTHENING EXERCISES - Epicondylitis, Lateral (Tennis Elbow) These  exercises may help you when beginning to rehabilitate your injury. They may resolve your symptoms with or without further involvement from your physician, physical therapist or athletic trainer. While completing these exercises, remember:   Muscles can gain both the endurance and the strength needed for everyday activities through controlled exercises.   Complete these exercises as instructed by your physician, physical therapist or athletic trainer. Increase the resistance and repetitions only as guided.   You may experience muscle soreness or fatigue, but the pain or discomfort you are trying to eliminate should never worsen during these exercises. If this pain does get worse, stop and make sure you are following the directions exactly. If the pain is still present after adjustments, discontinue the exercise until you can discuss the trouble with your caregiver.  STRENGTH - Wrist Flexors  Sit with your right / left forearm palm-up and fully supported on a table or countertop. Your elbow should be resting below the height of your shoulder. Allow your wrist to extend over the edge of the surface.   Loosely holding a __________ weight, or a piece of rubber exercise band or tubing, slowly curl your hand up  toward your forearm.   Hold this position for __________ seconds. Slowly lower the wrist back to the starting position in a controlled manner.  Repeat __________ times. Complete this exercise __________ times per day.  STRENGTH - Wrist Extensors  Sit with your right / left forearm palm-down and fully supported on a table or countertop. Your elbow should be resting below the height of your shoulder. Allow your wrist to extend over the edge of the surface.   Loosely holding a __________ weight, or a piece of rubber exercise band or tubing, slowly curl your hand up toward your forearm.   Hold this position for __________ seconds. Slowly lower the wrist back to the starting position in a controlled  manner.  Repeat __________ times. Complete this exercise __________ times per day.  STRENGTH - Ulnar Deviators  Stand with a ____________________ weight in your right / left hand, or sit while holding a rubber exercise band or tubing, with your healthy arm supported on a table or countertop.   Move your wrist, so that your pinkie travels toward your forearm and your thumb moves away from your forearm.   Hold this position for __________ seconds and then slowly lower the wrist back to the starting position.  Repeat __________ times. Complete this exercise __________ times per day STRENGTH - Radial Deviators  Stand with a ____________________ weight in your right / left hand, or sit while holding a rubber exercise band or tubing, with your injured arm supported on a table or countertop.   Raise your hand upward in front of you or pull up on the rubber tubing.   Hold this position for __________ seconds and then slowly lower the wrist back to the starting position.  Repeat __________ times. Complete this exercise __________ times per day. STRENGTH - Forearm Supinators   Sit with your right / left forearm supported on a table, keeping your elbow below shoulder height. Rest your hand over the edge, palm down.   Gently grip a hammer or a soup ladle.   Without moving your elbow, slowly turn your palm and hand upward to a "thumbs-up" position.   Hold this position for __________ seconds. Slowly return to the starting position.  Repeat __________ times. Complete this exercise __________ times per day.  STRENGTH - Forearm Pronators   Sit with your right / left forearm supported on a table, keeping your elbow below shoulder height. Rest your hand over the edge, palm up.   Gently grip a hammer or a soup ladle.   Without moving your elbow, slowly turn your palm and hand upward to a "thumbs-up" position.   Hold this position for __________ seconds. Slowly return to the starting position.    Repeat __________ times. Complete this exercise __________ times per day.  STRENGTH - Grip  Grasp a tennis ball, a dense sponge, or a large, rolled sock in your hand.   Squeeze as hard as you can, without increasing any pain.   Hold this position for __________ seconds. Release your grip slowly.  Repeat __________ times. Complete this exercise __________ times per day.  STRENGTH - Elbow Extensors, Isometric  Stand or sit upright, on a firm surface. Place your right / left arm so that your palm faces your stomach, and it is at the height of your waist.   Place your opposite hand on the underside of your forearm. Gently push up as your right / left arm resists. Push as hard as you can with both arms, without  causing any pain or movement at your right / left elbow. Hold this stationary position for __________ seconds.  Gradually release the tension in both arms. Allow your muscles to relax completely before repeating. Document Released: 06/21/2005 Document Revised: 06/10/2011 Document Reviewed: 10/03/2008 [facility] Patient Information 2012 [place], [place].

## 2011-12-28 MED ORDER — METHYLPREDNISOLONE ACETATE 40 MG/ML IJ SUSP: 40.0000 mg | Freq: Once | INTRAMUSCULAR | Status: DC

## 2011-12-28 NOTE — Assessment & Plan Note (Signed)
She will try hydroquinone cream

## 2011-12-28 NOTE — Progress Notes (Signed)
Subjective:    Patient ID: Melissa Gibson, female    DOB: [birth year], 46 y.o.   MRN: [id number]  Rash This is a chronic problem. The current episode started more than 1 year ago. The problem has been gradually worsening since onset. The affected locations include the face. Rash characteristics: discoloration. She was exposed to nothing. Pertinent negatives include no congestion, cough, diarrhea, facial edema, fatigue, fever, rhinorrhea, shortness of breath, sore throat or vomiting. Past treatments include nothing.  Arthritis Presents for initial visit. The disease course has been worsening. Onset time: 2. She complains of pain. She reports no stiffness, joint swelling or joint warmth. Affected locations include the right elbow. Her pain is at a severity of 3/10. Associated symptoms include rash. Pertinent negatives include no diarrhea, fatigue, fever, pain at night or pain while resting. Her pertinent risk factors include overuse. Past treatments include nothing. Factors aggravating her arthritis include gripping.      Review of Systems  Constitutional: Negative for fever, chills, diaphoresis, activity change, appetite change, fatigue and unexpected weight change.  HENT: Negative.  Negative for congestion, sore throat and rhinorrhea.   Eyes: Negative.   Respiratory: Negative for cough, chest tightness, shortness of breath, wheezing and stridor.   Cardiovascular: Negative for chest pain, palpitations and leg swelling.  Gastrointestinal: Negative for nausea, vomiting, abdominal pain, diarrhea, constipation, blood in stool and abdominal distention.  Genitourinary: Negative.   Musculoskeletal: Positive for arthralgias (right elbow) and arthritis. Negative for myalgias, back pain, joint swelling, gait problem and stiffness.  Skin: Positive for color change (on her face) and rash. Negative for pallor and wound.  Neurological: Negative.   Hematological: Negative for adenopathy. Does not bruise/bleed  easily.  Psychiatric/Behavioral: Negative.        Objective:   Physical Exam  Vitals reviewed. Constitutional: She is oriented to person, place, and time. She appears well-developed and well-nourished. No distress.  HENT:  Head: Normocephalic and atraumatic.  Mouth/Throat: Oropharynx is clear and moist. No oropharyngeal exudate.  Eyes: Conjunctivae are normal. Right eye exhibits no discharge. Left eye exhibits no discharge. No scleral icterus.  Neck: Normal range of motion. Neck supple. No JVD present. No tracheal deviation present. No thyromegaly present.  Cardiovascular: Normal rate, regular rhythm, normal heart sounds and intact distal pulses.  Exam reveals no gallop and no friction rub.   No murmur heard. Pulmonary/Chest: Effort normal and breath sounds normal. No stridor. No respiratory distress. She has no wheezes. She has no rales. She exhibits no tenderness.  Musculoskeletal: Normal range of motion. She exhibits no edema and no tenderness.       Right elbow: She exhibits normal range of motion, no swelling, no effusion and no deformity. tenderness found. Lateral epicondyle tenderness noted. No radial head, no medial epicondyle and no olecranon process tenderness noted.       Right elbow injection over the lateral epicondyle at the point of max. ttp near the insertion, the area was cleaned with betadine, local anesthesia was obtained with 2% lido with epi (1/2 inch 30 g), then a needle was inserted over the lat epic down to the bone and withdrawn 1 in and was injected with 1/2 cc depo-medrol (40 mg) 1 in 30 g needle, she tolerated the proc well with no complications  Lymphadenopathy:    She has no cervical adenopathy.  Neurological: She is oriented to person, place, and time.  Skin: Skin is warm and dry. Rash (pigment on her face is mottled with areas of hyperpigmentation)  noted. No abrasion, no bruising, no burn, no ecchymosis, no laceration, no lesion, no petechiae and no purpura  noted. Rash is not macular, not papular, not maculopapular, not nodular, not pustular and not urticarial. She is not diaphoretic. No cyanosis or erythema. No pallor. Nails show no clubbing.  Psychiatric: She has a normal mood and affect. Her behavior is normal. Judgment and thought content normal.      Lab Results  Component Value Date   WBC 8.3 01/29/2010   HGB 14.8 01/29/2010   HCT 42.2 01/29/2010   PLT 273.0 01/29/2010   GLUCOSE 86 01/29/2010   CHOL 201* 01/29/2010   TRIG 59.0 01/29/2010   HDL 58.20 01/29/2010   LDLDIRECT 125.0 01/29/2010   ALT 27 01/29/2010   AST 20 01/29/2010   NA 139 01/29/2010   K 4.9 01/29/2010   CL 98 01/29/2010   CREATININE 0.7 01/29/2010   BUN 18 01/29/2010   CO2 27 01/29/2010   TSH 1.49 06/04/2011      Assessment & Plan:

## 2011-12-28 NOTE — Assessment & Plan Note (Signed)
Injection done, she will try nsaids, pt ed material was given as well

## 2012-01-13 ENCOUNTER — Ambulatory Visit
Admission: RE | Admit: 2012-01-13 | Discharge: 2012-01-13 | Disposition: A | Discharge status: Home / Self Care | Payer: BC Managed Care – PPO | Source: Ambulatory Visit | Attending: Internal Medicine | Admitting: Internal Medicine

## 2012-01-13 DIAGNOSIS — Z1231 Encounter for screening mammogram for malignant neoplasm of breast: Secondary | ICD-10-CM

## 2012-01-18 LAB — HM MAMMOGRAPHY: HM Mammogram: NORMAL

## 2012-07-10 ENCOUNTER — Encounter: Payer: Self-pay | Admitting: Internal Medicine

## 2012-07-10 ENCOUNTER — Ambulatory Visit (INDEPENDENT_AMBULATORY_CARE_PROVIDER_SITE_OTHER): Payer: BC Managed Care – PPO | Admitting: Internal Medicine

## 2012-07-10 VITALS — BP 138/90 | HR 84 | Temp 98.7°F | Resp 16 | Wt 165.0 lb

## 2012-07-10 DIAGNOSIS — M7710 Lateral epicondylitis, unspecified elbow: Secondary | ICD-10-CM

## 2012-07-10 MED ORDER — METHYLPREDNISOLONE ACETATE 40 MG/ML IJ SUSP: 40.0000 mg | Freq: Once | INTRAMUSCULAR | Status: DC

## 2012-07-10 NOTE — Progress Notes (Signed)
  Subjective:    Patient ID: Melissa Gibson, female    DOB: 09/23/1965, 47 y.o.   MRN: [id number]  Arthritis Presents for follow-up visit. She complains of pain. She reports no stiffness, joint swelling or joint warmth. Affected locations include the right elbow. Her pain is at a severity of 2/10. Pertinent negatives include no diarrhea, dry eyes, dry mouth, dysuria, fatigue, fever, pain at night, pain while resting, rash, Raynaud's syndrome, uveitis or weight loss.      Review of Systems  Constitutional: Negative.  Negative for fever, weight loss and fatigue.  HENT: Negative.   Eyes: Negative.   Respiratory: Negative.   Cardiovascular: Negative.   Gastrointestinal: Negative.  Negative for diarrhea.  Genitourinary: Negative.  Negative for dysuria.  Musculoskeletal: Positive for arthralgias and arthritis. Negative for myalgias, back pain, joint swelling, gait problem and stiffness.  Skin: Negative.  Negative for rash.  Neurological: Negative.   Hematological: Negative.   Psychiatric/Behavioral: Negative.        Objective:   Physical Exam  Musculoskeletal:       Right elbow: She exhibits normal range of motion, no swelling, no effusion, no deformity and no laceration. tenderness found. Lateral epicondyle tenderness noted. No radial head, no medial epicondyle and no olecranon process tenderness noted.       Right elbow was cleaned with betadine and prepped and draped. The area of tenderness over the lateral epicondyle was identified. A 27 g 1 inch needle was used to inject a 1 cc solution of 1/2 cc 2% lidocaine and 40 mg of depo-medrol. The solution was injected in such a manner as to fan it out over the lateral epicondyle. She tolerated it well with no complications.          Assessment & Plan:

## 2012-07-10 NOTE — Patient Instructions (Signed)
Lateral Epicondylitis (Tennis Elbow) with Rehab Lateral epicondylitis involves inflammation and pain around the outer portion of the elbow. The pain is caused by inflammation of the tendons in the forearm that bring back (extend) the wrist. Lateral epicondylittis is also called tennis elbow, because it is very common in tennis players. However, it may occur in any individual who extends the wrist repetitively. If lateral epicondylitis is left untreated, it may become a chronic problem. SYMPTOMS   Pain, tenderness, and inflammation on the outer (lateral) side of the elbow.  Pain or weakness with gripping activities.  Pain that increases with wrist twisting motions (playing tennis, using a screwdriver, opening a door or a jar).  Pain with lifting objects, including a coffee cup. CAUSES  Lateral epicondylitis is caused by inflammation of the tendons that extend the wrist. Causes of injury may include:  Repetitive stress and strain on the muscles and tendons that extend the wrist.  Sudden change in activity level or intensity.  Incorrect grip in racquet sports.  Incorrect grip size of racquet (often too large).  Incorrect hitting position or technique (usually backhand, leading with the elbow).  Using a racket that is too heavy. RISK INCREASES WITH:  Sports or occupations that require repetitive and/or strenuous forearm and wrist movements (tennis, squash, racquetball, carpentry).  Poor wrist and forearm strength and flexibility.  Failure to warm up properly before activity.  Resuming activity before healing, rehabilitation, and conditioning are complete. PREVENTION   Warm up and stretch properly before activity.  Maintain physical fitness:  Strength, flexibility, and endurance.  Cardiovascular fitness.  Wear and use properly fitted equipment.  Learn and use proper technique and have a coach correct improper technique.  Wear a tennis elbow (counterforce) brace. PROGNOSIS   The course of this condition depends on the degree of the injury. If treated properly, acute cases (symptoms lasting less than 4 weeks) are often resolved in 2 to 6 weeks. Chronic (longer lasting cases) often resolve in 3 to 6 months, but may require physical therapy. RELATED COMPLICATIONS   Frequently recurring symptoms, resulting in a chronic problem. Properly treating the problem the first time decreases frequency of recurrence.  Chronic inflammation, scarring tendon degeneration, and partial tendon tear, requiring surgery.  Delayed healing or resolution of symptoms. TREATMENT  Treatment first involves the use of ice and medicine, to reduce pain and inflammation. Strengthening and stretching exercises may help reduce discomfort, if performed regularly. These exercises may be performed at home, if the condition is an acute injury. Chronic cases may require a referral to a physical therapist for evaluation and treatment. Your caregiver may advise a corticosteroid injection, to help reduce inflammation. Rarely, surgery is needed. MEDICATION  If pain medicine is needed, nonsteroidal anti-inflammatory medicines (aspirin and ibuprofen), or other minor pain relievers (acetaminophen), are often advised.  Do not take pain medicine for 7 days before surgery.  Prescription pain relievers may be given, if your caregiver thinks they are needed. Use only as directed and only as much as you need.  Corticosteroid injections may be recommended. These injections should be reserved only for the most severe cases, because they can only be given a certain number of times. HEAT AND COLD  Cold treatment (icing) should be applied for 10 to 15 minutes every 2 to 3 hours for inflammation and pain, and immediately after activity that aggravates your symptoms. Use ice packs or an ice massage.  Heat treatment may be used before performing stretching and strengthening activities prescribed by your   caregiver, physical  therapist, or athletic trainer. Use a heat pack or a warm water soak. SEEK MEDICAL CARE IF: Symptoms get worse or do not improve in 2 weeks, despite treatment. EXERCISES  RANGE OF MOTION (ROM) AND STRETCHING EXERCISES - Epicondylitis, Lateral (Tennis Elbow) These exercises may help you when beginning to rehabilitate your injury. Your symptoms may go away with or without further involvement from your physician, physical therapist or athletic trainer. While completing these exercises, remember:   Restoring tissue flexibility helps normal motion to return to the joints. This allows healthier, less painful movement and activity.  An effective stretch should be held for at least 30 seconds.  A stretch should never be painful. You should only feel a gentle lengthening or release in the stretched tissue. RANGE OF MOTION  Wrist Flexion, Active-Assisted  Extend your right / left elbow with your fingers pointing down.*  Gently pull the back of your hand towards you, until you feel a gentle stretch on the top of your forearm.  Hold this position for __________ seconds. Repeat __________ times. Complete this exercise __________ times per day.  *If directed by your physician, physical therapist or athletic trainer, complete this stretch with your elbow bent, rather than extended. RANGE OF MOTION  Wrist Extension, Active-Assisted  Extend your right / left elbow and turn your palm upwards.*  Gently pull your palm and fingertips back, so your wrist extends and your fingers point more toward the ground.  You should feel a gentle stretch on the inside of your forearm.  Hold this position for __________ seconds. Repeat __________ times. Complete this exercise __________ times per day. *If directed by your physician, physical therapist or athletic trainer, complete this stretch with your elbow bent, rather than extended. STRETCH - Wrist Flexion  Place the back of your right / left hand on a tabletop,  leaving your elbow slightly bent. Your fingers should point away from your body.  Gently press the back of your hand down onto the table by straightening your elbow. You should feel a stretch on the top of your forearm.  Hold this position for __________ seconds. Repeat __________ times. Complete this stretch __________ times per day.  STRETCH  Wrist Extension   Place your right / left fingertips on a tabletop, leaving your elbow slightly bent. Your fingers should point backwards.  Gently press your fingers and palm down onto the table by straightening your elbow. You should feel a stretch on the inside of your forearm.  Hold this position for __________ seconds. Repeat __________ times. Complete this stretch __________ times per day.  STRENGTHENING EXERCISES - Epicondylitis, Lateral (Tennis Elbow) These exercises may help you when beginning to rehabilitate your injury. They may resolve your symptoms with or without further involvement from your physician, physical therapist or athletic trainer. While completing these exercises, remember:   Muscles can gain both the endurance and the strength needed for everyday activities through controlled exercises.  Complete these exercises as instructed by your physician, physical therapist or athletic trainer. Increase the resistance and repetitions only as guided.  You may experience muscle soreness or fatigue, but the pain or discomfort you are trying to eliminate should never worsen during these exercises. If this pain does get worse, stop and make sure you are following the directions exactly. If the pain is still present after adjustments, discontinue the exercise until you can discuss the trouble with your caregiver. STRENGTH Wrist Flexors  Sit with your right / left forearm palm-up and   fully supported on a table or countertop. Your elbow should be resting below the height of your shoulder. Allow your wrist to extend over the edge of the  surface.  Loosely holding a __________ weight, or a piece of rubber exercise band or tubing, slowly curl your hand up toward your forearm.  Hold this position for __________ seconds. Slowly lower the wrist back to the starting position in a controlled manner. Repeat __________ times. Complete this exercise __________ times per day.  STRENGTH  Wrist Extensors  Sit with your right / left forearm palm-down and fully supported on a table or countertop. Your elbow should be resting below the height of your shoulder. Allow your wrist to extend over the edge of the surface.  Loosely holding a __________ weight, or a piece of rubber exercise band or tubing, slowly curl your hand up toward your forearm.  Hold this position for __________ seconds. Slowly lower the wrist back to the starting position in a controlled manner. Repeat __________ times. Complete this exercise __________ times per day.  STRENGTH - Ulnar Deviators  Stand with a ____________________ weight in your right / left hand, or sit while holding a rubber exercise band or tubing, with your healthy arm supported on a table or countertop.  Move your wrist, so that your pinkie travels toward your forearm and your thumb moves away from your forearm.  Hold this position for __________ seconds and then slowly lower the wrist back to the starting position. Repeat __________ times. Complete this exercise __________ times per day STRENGTH - Radial Deviators  Stand with a ____________________ weight in your right / left hand, or sit while holding a rubber exercise band or tubing, with your injured arm supported on a table or countertop.  Raise your hand upward in front of you or pull up on the rubber tubing.  Hold this position for __________ seconds and then slowly lower the wrist back to the starting position. Repeat __________ times. Complete this exercise __________ times per day. STRENGTH  Forearm Supinators   Sit with your right /  left forearm supported on a table, keeping your elbow below shoulder height. Rest your hand over the edge, palm down.  Gently grip a hammer or a soup ladle.  Without moving your elbow, slowly turn your palm and hand upward to a "thumbs-up" position.  Hold this position for __________ seconds. Slowly return to the starting position. Repeat __________ times. Complete this exercise __________ times per day.  STRENGTH  Forearm Pronators   Sit with your right / left forearm supported on a table, keeping your elbow below shoulder height. Rest your hand over the edge, palm up.  Gently grip a hammer or a soup ladle.  Without moving your elbow, slowly turn your palm and hand upward to a "thumbs-up" position.  Hold this position for __________ seconds. Slowly return to the starting position. Repeat __________ times. Complete this exercise __________ times per day.  STRENGTH - Grip  Grasp a tennis ball, a dense sponge, or a large, rolled sock in your hand.  Squeeze as hard as you can, without increasing any pain.  Hold this position for __________ seconds. Release your grip slowly. Repeat __________ times. Complete this exercise __________ times per day.  STRENGTH - Elbow Extensors, Isometric  Stand or sit upright, on a firm surface. Place your right / left arm so that your palm faces your stomach, and it is at the height of your waist.  Place your opposite hand on   the underside of your forearm. Gently push up as your right / left arm resists. Push as hard as you can with both arms, without causing any pain or movement at your right / left elbow. Hold this stationary position for __________ seconds. Gradually release the tension in both arms. Allow your muscles to relax completely before repeating. Document Released: 06/21/2005 Document Revised: 09/13/2011 Document Reviewed: 10/03/2008 [employer] Patient Information 2013 [employer].  

## 2012-07-10 NOTE — Assessment & Plan Note (Signed)
Continue nsaids/rest/ice

## 2013-01-12 ENCOUNTER — Other Ambulatory Visit: Payer: Self-pay | Admitting: Nurse Practitioner

## 2013-01-12 NOTE — Telephone Encounter (Signed)
eScribe request for refill on LEVOTHYROXINE Last filled - 02/22/12 X 1 YEAR Last AEX - 02/22/12 Next AEX - 02/23/13 Please advise if refill OK until AEX.  Chart is on your shelf.

## 2013-01-12 NOTE — Telephone Encounter (Signed)
Pt did not return call.  1 month supply sent to pharmacy to cover the patient over the weekend.

## 2013-02-16 ENCOUNTER — Other Ambulatory Visit: Payer: Self-pay | Admitting: Nurse Practitioner

## 2013-02-19 NOTE — Telephone Encounter (Signed)
AEX scheduled for 02/23/13 #30/0rfs sent through to last pt. Until AEX

## 2013-02-22 ENCOUNTER — Encounter: Payer: Self-pay | Admitting: *Deleted

## 2013-02-23 ENCOUNTER — Ambulatory Visit (INDEPENDENT_AMBULATORY_CARE_PROVIDER_SITE_OTHER): Payer: BC Managed Care – PPO | Admitting: Nurse Practitioner

## 2013-02-23 ENCOUNTER — Encounter: Payer: Self-pay | Admitting: Nurse Practitioner

## 2013-02-23 VITALS — BP 126/70 | HR 74 | Resp 12 | Ht 65.25 in | Wt 169.0 lb

## 2013-02-23 DIAGNOSIS — Z01419 Encounter for gynecological examination (general) (routine) without abnormal findings: Secondary | ICD-10-CM

## 2013-02-23 DIAGNOSIS — E039 Hypothyroidism, unspecified: Secondary | ICD-10-CM

## 2013-02-23 DIAGNOSIS — Z0000 Encounter for general adult medical examination without abnormal findings: Secondary | ICD-10-CM

## 2013-02-23 LAB — COMPREHENSIVE METABOLIC PANEL
ALT: 22 U/L (ref 0–35)
AST: 20 U/L (ref 0–37)
Alkaline Phosphatase: 67 U/L (ref 39–117)
Sodium: 137 mEq/L (ref 135–145)
Total Bilirubin: 0.4 mg/dL (ref 0.3–1.2)
Total Protein: 6.7 g/dL (ref 6.0–8.3)

## 2013-02-23 LAB — POCT URINALYSIS DIPSTICK
Spec Grav, UA: 1.015
Urobilinogen, UA: NEGATIVE

## 2013-02-23 LAB — HEMOGLOBIN, FINGERSTICK: Hemoglobin, fingerstick: 14.2 g/dL (ref 12.0–16.0)

## 2013-02-23 LAB — LIPID PANEL
HDL: 61 mg/dL (ref >39)
LDL Cholesterol: 122 mg/dL — ABNORMAL HIGH (ref 0–99)
Total CHOL/HDL Ratio: 3.3 Ratio

## 2013-02-23 MED ORDER — LEVOTHYROXINE SODIUM 75 MCG PO TABS: ORAL_TABLET | ORAL | Status: DC

## 2013-02-23 NOTE — Progress Notes (Signed)
47 y.o. G0P0 Married Caucasian Fe here for annual exam.  Occasional vaso symptoms, no vaginal dryness.  Recently has noticed dyspnea with exertion.while on spin bike. She also has increased her Synthroid to double dosing secondary to fatigue.  Ran out of med's 3 days ago.  No LMP recorded. Patient has had an ablation.          Sexually active: yes  The current method of family planning is tubal ligation.    Exercising: yes  walk, spin and yoga Smoker:  no  Health Maintenance: Pap:  02/22/2012  Normal with negative HR HPV MMG:  01/13/2012 normal Colonoscopy:  never BMD:   never TDaP:  10/05/2010 Labs: Hgb- 14.2    reports that she has never smoked. She has never used smokeless tobacco. She reports that  drinks alcohol. She reports that she does not use illicit drugs.  Past Medical History  Diagnosis Date  . Hyperlipidemia   . Hypothyroid   . Dysmenorrhea   . Menorrhagia     Past Surgical History  Procedure Laterality Date  . Tubal ligation    . Novasure ablation      Current Outpatient Prescriptions  Medication Sig Dispense Refill  . aspirin 81 MG tablet Take 81 mg by mouth daily.      . cholecalciferol (VITAMIN D) 1000 UNITS tablet Take 1,000 Units by mouth daily.      Marland Kitchen levothyroxine (SYNTHROID, LEVOTHROID) 75 MCG tablet TAKE 1 TABLET BY MOUTH ONCE DAILY  30 tablet  0  . Multiple Vitamin (MULTIVITAMIN) tablet Take 1 tablet by mouth daily.      Marland Kitchen pyridOXINE (VITAMIN B-6) 100 MG tablet Take 100 mg by mouth daily.       No current facility-administered medications for this visit.    Family History  Problem Relation Age of Onset  . Coronary artery disease Other   . Lung cancer Other   . Sudden death Other   . Hyperlipidemia Other   . Hypertension Other   . Heart attack Father   . Hypertension Father   . Heart failure Father   . Cancer Sister     lung cancer  . Infertility Sister   . Thyroid disease Sister   . Cancer Maternal Grandmother     lymph nodes    ROS:   Pertinent items are noted in HPI.  Otherwise, a comprehensive ROS was negative.  Exam:   BP 126/70  Pulse 74  Resp 12  Ht 5' 5.25" (1.657 m)  Wt 169 lb (76.658 kg)  BMI 27.92 kg/m2 Height: 5' 5.25" (165.7 cm)  Ht Readings from Last 3 Encounters:  02/23/13 5' 5.25" (1.657 m)  09/30/11 5' 4.5" (1.638 m)  06/04/11 5\' 4"  (1.626 m)    General appearance: alert, cooperative and appears stated age Head: Normocephalic, without obvious abnormality, atraumatic Neck: no adenopathy, supple, symmetrical, trachea midline and thyroid normal to inspection and palpation Lungs: clear to auscultation bilaterally Breasts: normal appearance, no masses or tenderness Heart: regular rate and rhythm Abdomen: soft, non-tender; no masses,  no organomegaly Extremities: extremities normal, atraumatic, no cyanosis or edema Skin: Skin color, texture, turgor normal. No rashes or lesions Lymph nodes: Cervical, supraclavicular, and axillary nodes normal. No abnormal inguinal nodes palpated Neurologic: Grossly normal   Pelvic: External genitalia:  no lesions              Urethra:  normal appearing urethra with no masses, tenderness or lesions  Bartholin's and Skene's: normal                 Vagina: normal appearing vagina with normal color and discharge, no lesions              Cervix: anteverted              Pap taken: no Bimanual Exam:  Uterus:  normal size, contour, position, consistency, mobility, non-tender              Adnexa: no mass, fullness, tenderness               Rectovaginal: Confirms               Anus:  normal sphincter tone, no lesions  A:  Well Woman with normal exam  History of hypothyroid on replacement treatment - but recent increase in med's. On her own.  S/P Nova sure 11/2010  Recent exertional dyspnea on spin bike  P:   Pap smear as per guidelines Not done today  Labs - will follow may need synthroid adjustment  Mammogram due now and patient to schedule  Advise patient  to see PCP about exertional dyspnea because of her history of smoking and the [facility] of HD.  Also advised patient not to change dose of synthroid unless directed as this can also cause side effects.  Counseled on breast self exam, adequate intake of calcium and vitamin D, diet and exercise return annually or prn  An After Visit Summary was printed and given to the patient.

## 2013-02-23 NOTE — Patient Instructions (Signed)

## 2013-02-24 NOTE — Progress Notes (Signed)
Encounter reviewed by Dr. Brook Silva.  

## 2013-02-26 ENCOUNTER — Telehealth: Payer: Self-pay | Admitting: Nurse Practitioner

## 2013-02-26 MED ORDER — LEVOTHYROXINE SODIUM 75 MCG PO TABS: ORAL_TABLET | ORAL | Status: DC

## 2013-02-26 NOTE — Telephone Encounter (Signed)
Patient is returning Stephanie's call

## 2013-02-26 NOTE — Telephone Encounter (Signed)
Notified of all lab results.  Patient is taking OTC Vit D 1000 IU daily.  Discussed TSH result and need to stay at once per day and recheck TSH in one month.  Needs RX to [facility] and is coming for lab appt on 03-26-13.  RX sent.

## 2013-02-26 NOTE — Telephone Encounter (Signed)
Left patient a message to [facility] and ask for Judeth Cornfield who can review her labs.

## 2013-03-23 ENCOUNTER — Other Ambulatory Visit: Payer: Self-pay | Admitting: Nurse Practitioner

## 2013-03-23 NOTE — Telephone Encounter (Signed)
Please advise patient was seen for AEX 02/23/13 no rx was given @ AEX.   Patient's last TSH level was 0.092  02/26/13 #30/0rf's was sent to [facility].

## 2013-03-26 ENCOUNTER — Other Ambulatory Visit: Payer: Self-pay | Admitting: Nurse Practitioner

## 2013-03-26 ENCOUNTER — Other Ambulatory Visit (INDEPENDENT_AMBULATORY_CARE_PROVIDER_SITE_OTHER): Payer: BC Managed Care – PPO

## 2013-03-26 DIAGNOSIS — E039 Hypothyroidism, unspecified: Secondary | ICD-10-CM

## 2013-03-26 MED ORDER — LEVOTHYROXINE SODIUM 75 MCG PO TABS: ORAL_TABLET | ORAL | Status: DC

## 2013-04-04 NOTE — Progress Notes (Signed)
9/24, 10/1 LM//SP

## 2013-05-10 ENCOUNTER — Other Ambulatory Visit: Payer: Self-pay

## 2013-06-22 ENCOUNTER — Other Ambulatory Visit (INDEPENDENT_AMBULATORY_CARE_PROVIDER_SITE_OTHER): Payer: BC Managed Care – PPO

## 2013-06-22 ENCOUNTER — Other Ambulatory Visit: Payer: Self-pay | Admitting: Nurse Practitioner

## 2013-06-22 DIAGNOSIS — E039 Hypothyroidism, unspecified: Secondary | ICD-10-CM

## 2013-11-29 ENCOUNTER — Other Ambulatory Visit: Payer: Self-pay

## 2013-11-29 DIAGNOSIS — Z1231 Encounter for screening mammogram for malignant neoplasm of breast: Secondary | ICD-10-CM

## 2013-12-21 ENCOUNTER — Ambulatory Visit: Payer: BC Managed Care – PPO

## 2013-12-25 ENCOUNTER — Telehealth: Payer: Self-pay | Admitting: Internal Medicine

## 2013-12-25 NOTE — Telephone Encounter (Signed)
Patient is requesting to [facility] to [facility], due to location. Is this ok? Thanks!

## 2013-12-25 NOTE — Telephone Encounter (Signed)
Ok with me 

## 2013-12-26 NOTE — Telephone Encounter (Signed)
Left detailed message informing patient that [facility] can [facility].

## 2013-12-26 NOTE — Telephone Encounter (Signed)
yes

## 2013-12-28 ENCOUNTER — Ambulatory Visit
Admission: RE | Admit: 2013-12-28 | Discharge: 2013-12-28 | Disposition: A | Discharge status: Home / Self Care | Payer: BC Managed Care – PPO | Source: Ambulatory Visit

## 2013-12-28 ENCOUNTER — Ambulatory Visit: Payer: BC Managed Care – PPO

## 2013-12-28 DIAGNOSIS — Z1231 Encounter for screening mammogram for malignant neoplasm of breast: Secondary | ICD-10-CM

## 2014-01-15 ENCOUNTER — Ambulatory Visit: Payer: BC Managed Care – PPO | Admitting: Family

## 2014-01-18 ENCOUNTER — Encounter: Payer: Self-pay | Admitting: Family

## 2014-01-18 ENCOUNTER — Ambulatory Visit (INDEPENDENT_AMBULATORY_CARE_PROVIDER_SITE_OTHER): Payer: BC Managed Care – PPO | Admitting: Family

## 2014-01-18 ENCOUNTER — Ambulatory Visit (HOSPITAL_BASED_OUTPATIENT_CLINIC_OR_DEPARTMENT_OTHER)
Admission: RE | Admit: 2014-01-18 | Discharge: 2014-01-18 | Disposition: A | Discharge status: Home / Self Care | Payer: BC Managed Care – PPO | Source: Ambulatory Visit | Attending: Family | Admitting: Family

## 2014-01-18 VITALS — BP 138/84 | HR 64 | Temp 98.5°F | Resp 16 | Ht 64.5 in | Wt 179.1 lb

## 2014-01-18 DIAGNOSIS — M25552 Pain in left hip: Secondary | ICD-10-CM

## 2014-01-18 DIAGNOSIS — M25559 Pain in unspecified hip: Secondary | ICD-10-CM

## 2014-01-18 DIAGNOSIS — M2550 Pain in unspecified joint: Secondary | ICD-10-CM

## 2014-01-18 DIAGNOSIS — E039 Hypothyroidism, unspecified: Secondary | ICD-10-CM

## 2014-01-18 NOTE — Progress Notes (Signed)
   Subjective:    Patient ID: Melissa Gibson, female    DOB: 05/11/1966, 48 y.o.   MRN: [id number]  HPI  [name] is a 48 yr old female who presents today in transfer of care from Dr. Yetta BarreJones at our [place] location.  Diet- Not lately for past 6 month eating poorly. Exercise- no Exercise. Former walker. Smoking-no. Alcohol-2-3 beers a week. Vision problems- Only uses reading glasses. Pt has seen optometrist. Hearing problems-then states sometimes feels like can't hear well. Family members as well. Colonoscopy-Never had done. Last Pap Smear-done last year. Test was normal.   Acute problems today- Pt does have generalized joint pain x 1year. Achy hips after sitting for a while L>R. Knees hurt, but not as bad as lt hip. Also some bilateral hand pain.(worse in am). Pt thinks hand strength. Symptoms more for about a year. Grandad had arthritis in knees. Mom neck arthritis.    Review of Systems  Constitutional: Negative for fever, chills and fatigue.  HENT: Negative.        None reported. Except for decreased hearing.  Respiratory: Negative for chest tightness, shortness of breath and wheezing.   Cardiovascular: Negative for chest pain and palpitations.  Musculoskeletal: Positive for arthralgias.         Objective:   Physical Exam  Constitutional: She is oriented to person, place, and time. She appears well-developed and well-nourished. No distress.  Cardiovascular: Normal rate and regular rhythm.   No murmur heard. Pulmonary/Chest: Effort normal and breath sounds normal. No respiratory distress. She has no wheezes. She has no rales. She exhibits no tenderness.  Musculoskeletal: She exhibits no edema.  Neurological: She is alert and oriented to person, place, and time.  Psychiatric: She has a normal mood and affect. Her behavior is normal. Judgment and thought content normal.          Assessment & Plan:  Patient seen along with Kristine GarbeEdward Saguire PA-C who is currently in  orientation for training purposes. I have personally examined pt and agree with assessment and plan.

## 2014-01-18 NOTE — Progress Notes (Signed)
Pre visit review using our clinic review tool, if applicable. No additional management support is needed unless otherwise documented below in the visit note. 

## 2014-01-18 NOTE — Patient Instructions (Signed)
Please complete lab work prior to leaving. Complete x ray of hip on the first floor in the imaging department.  Schedule fasting physical at your convenience.

## 2014-01-19 LAB — SEDIMENTATION RATE: SED RATE: 4 mm/h (ref 0–22)

## 2014-01-19 LAB — RHEUMATOID FACTOR: Rhuematoid fact SerPl-aCnc: <10 IU/mL (ref <=14)

## 2014-01-19 LAB — TSH: TSH: 3.259 u[IU]/mL (ref 0.350–4.500)

## 2014-01-20 ENCOUNTER — Encounter: Payer: Self-pay | Admitting: Family

## 2014-01-20 DIAGNOSIS — M25552 Pain in left hip: Secondary | ICD-10-CM | POA: Insufficient documentation

## 2014-01-20 NOTE — Assessment & Plan Note (Addendum)
Likely OA, hip x ray is unremarkable. Consider ortho referral if symptoms worsen or if symptoms do not improve.   ESR, RA negative.

## 2014-01-21 LAB — ANA: ANA: NEGATIVE

## 2014-02-28 ENCOUNTER — Ambulatory Visit (INDEPENDENT_AMBULATORY_CARE_PROVIDER_SITE_OTHER): Payer: BC Managed Care – PPO | Admitting: Nurse Practitioner

## 2014-02-28 ENCOUNTER — Encounter: Payer: Self-pay | Admitting: Nurse Practitioner

## 2014-02-28 VITALS — BP 120/82 | HR 68 | Ht 65.0 in | Wt 178.0 lb

## 2014-02-28 DIAGNOSIS — Z0000 Encounter for general adult medical examination without abnormal findings: Secondary | ICD-10-CM

## 2014-02-28 DIAGNOSIS — Z01419 Encounter for gynecological examination (general) (routine) without abnormal findings: Secondary | ICD-10-CM

## 2014-02-28 DIAGNOSIS — Z1211 Encounter for screening for malignant neoplasm of colon: Secondary | ICD-10-CM

## 2014-02-28 LAB — COMPREHENSIVE METABOLIC PANEL
ALBUMIN: 4 g/dL (ref 3.5–5.2)
ALT: 28 U/L (ref 0–35)
AST: 22 U/L (ref 0–37)
Alkaline Phosphatase: 75 U/L (ref 39–117)
BUN: 20 mg/dL (ref 6–23)
CO2: 30 meq/L (ref 19–32)
CREATININE: 0.86 mg/dL (ref 0.50–1.10)
Calcium: 9.7 mg/dL (ref 8.4–10.5)
Chloride: 103 mEq/L (ref 96–112)
Glucose, Bld: 71 mg/dL (ref 70–99)
Potassium: 4.2 mEq/L (ref 3.5–5.3)
Sodium: 139 mEq/L (ref 135–145)
TOTAL PROTEIN: 6.8 g/dL (ref 6.0–8.3)
Total Bilirubin: 0.4 mg/dL (ref 0.2–1.2)

## 2014-02-28 LAB — POCT URINALYSIS DIPSTICK
BILIRUBIN UA: NEGATIVE
Blood, UA: NEGATIVE
GLUCOSE UA: NEGATIVE
Ketones, UA: NEGATIVE
LEUKOCYTES UA: NEGATIVE
NITRITE UA: NEGATIVE
PH UA: 5
Protein, UA: NEGATIVE
Urobilinogen, UA: NEGATIVE

## 2014-02-28 LAB — LIPID PANEL
Cholesterol: 178 mg/dL (ref 0–200)
HDL: 55 mg/dL (ref >39)
LDL Cholesterol: 101 mg/dL — ABNORMAL HIGH (ref 0–99)
TRIGLYCERIDES: 110 mg/dL (ref <150)
Total CHOL/HDL Ratio: 3.2 Ratio
VLDL: 22 mg/dL (ref 0–40)

## 2014-02-28 LAB — HEMOGLOBIN, FINGERSTICK: Hemoglobin, fingerstick: 14.9 g/dL (ref 12.0–16.0)

## 2014-02-28 MED ORDER — LEVOTHYROXINE SODIUM 75 MCG PO TABS: ORAL_TABLET | ORAL | Status: DC

## 2014-02-28 NOTE — Patient Instructions (Signed)

## 2014-02-28 NOTE — Progress Notes (Signed)
Patient ID: Melissa Gibson, female   DOB: December 16, 1965, 48 y.o.   MRN: [id number] 49 y.o. G0P0 Married Caucasian Fe here for annual exam.  In October 2014 with a day and half of a normal menses.  Had associated cramps and PMS.  Some night sweats and mood changes.  TSH is normal done on 01/18/14 at 3.259.  Patient's last menstrual period was 04/17/2013.    Novasure Ablation      Sexually active: yes   The current method of family planning is tubal ligation, ablation.     Exercising: no regular exercise Smoker:  no  Health Maintenance: Pap:  02/22/2012  Normal with negative HR HPV MMG:  12/28/13, Bi-Rads 1: negative  TDaP:  10/05/2010 Labs:  HB:  14.9  Urine:   Negative    reports that she quit smoking about 5 years ago. Her smoking use included Cigarettes. She has a 10 pack-year smoking history. She has never used smokeless tobacco. She reports that she drinks alcohol. She reports that she does not use illicit drugs.  Past Medical History  Diagnosis Date  . Hyperlipidemia   . Dysmenorrhea   . Menorrhagia   . Hypothyroid 10/05/10    Past Surgical History  Procedure Laterality Date  . Novasure ablation  11/2010  . Tubal ligation  2006    Current Outpatient Prescriptions  Medication Sig Dispense Refill  . aspirin 81 MG tablet Take 81 mg by mouth daily.      . cholecalciferol (VITAMIN D) 1000 UNITS tablet Take 1,000 Units by mouth daily.      Marland Kitchen levothyroxine (SYNTHROID, LEVOTHROID) 75 MCG tablet TAKE 1 TABLET BY MOUTH ONCE DAILY  90 tablet  3  . Multiple Vitamin (MULTIVITAMIN) tablet Take 1 tablet by mouth daily.      Marland Kitchen pyridOXINE (VITAMIN B-6) 100 MG tablet Take 100 mg by mouth daily.       No current facility-administered medications for this visit.    Family History  Problem Relation Age of Onset  . Heart attack Father   . Hypertension Father   . Heart failure Father   . Hyperlipidemia Father   . Lung cancer Sister 67    lung cancer  . Infertility Sister   . Thyroid disease  Sister   . Cancer Sister     lung; died at 39yr  . Cancer Maternal Grandmother     lymph nodes  . COPD Mother   . Hyperlipidemia Mother   . Heart attack Brother   . COPD Brother   . Thyroid disease Sister   . Arthritis Maternal Grandfather     ROS:  Pertinent items are noted in HPI.  Otherwise, a comprehensive ROS was negative.  Exam:   BP 120/82  Pulse 68  Ht  (1.651 m)  Wt 178 lb (80.74 kg)  BMI 29.62 kg/m2  LMP 04/17/2013 Height:  (165.1 cm)  Ht Readings from Last 3 Encounters:  02/28/14  (1.651 m)  01/18/14 5' 4.5" (1.638 m)  02/23/13 5' 5.25" (1.657 m)    General appearance: alert, cooperative and appears stated age Head: Normocephalic, without obvious abnormality, atraumatic Neck: no adenopathy, supple, symmetrical, trachea midline and thyroid normal to inspection and palpation Lungs: clear to auscultation bilaterally Breasts: normal appearance, no masses or tenderness Heart: regular rate and rhythm Abdomen: soft, non-tender; no masses,  no organomegaly Extremities: extremities normal, atraumatic, no cyanosis or edema Skin: Skin color, texture, turgor normal. No rashes or lesions Lymph nodes: Cervical, supraclavicular,  and axillary nodes normal. No abnormal inguinal nodes palpated Neurologic: Grossly normal   Pelvic: External genitalia:  no lesions              Urethra:  normal appearing urethra with no masses, tenderness or lesions              Bartholin's and Skene's: normal                 Vagina: normal appearing vagina with normal color and discharge, no lesions              Cervix: anteverted              Pap taken: No. Bimanual Exam:  Uterus:  normal size, contour, position, consistency, mobility, non-tender              Adnexa: no mass, fullness, tenderness               Rectovaginal: Confirms               Anus:  normal sphincter tone, no lesions  A:  Well Woman with normal exam  S/P Novasure ablation 11/2010  S/P BTL  Hypothyroid    Perimenopausal symptoms    P:   Reviewed health and wellness pertinent to exam  Pap smear not taken today  Mammogram is due 6/16  Refill on Synthroid 75 mcg daily  Will follow with labs (did not repeat TSH)  Counseled on breast self exam, mammography screening, adequate intake of calcium and vitamin D, diet and exercise return annually or prn  An After Visit Summary was printed and given to the patient.

## 2014-03-01 LAB — VITAMIN D 25 HYDROXY (VIT D DEFICIENCY, FRACTURES): VIT D 25 HYDROXY: 35 ng/mL (ref 30–89)

## 2014-03-03 NOTE — Progress Notes (Signed)
Encounter reviewed by Dr. Patience Nuzzo Silva.  

## 2014-07-08 LAB — HM COLONOSCOPY

## 2014-07-10 HISTORY — PX: COLONOSCOPY W/ BIOPSIES: SHX1374

## 2014-10-22 ENCOUNTER — Ambulatory Visit (INDEPENDENT_AMBULATORY_CARE_PROVIDER_SITE_OTHER): Payer: BLUE CROSS/BLUE SHIELD | Admitting: Family

## 2014-10-22 ENCOUNTER — Encounter: Payer: Self-pay | Admitting: Family

## 2014-10-22 VITALS — BP 138/86 | HR 75 | Temp 98.2°F | Resp 18 | Ht 64.5 in | Wt 180.4 lb

## 2014-10-22 DIAGNOSIS — F411 Generalized anxiety disorder: Secondary | ICD-10-CM

## 2014-10-22 MED ORDER — CITALOPRAM HYDROBROMIDE 20 MG PO TABS: ORAL_TABLET | ORAL | Status: DC

## 2014-10-22 NOTE — Assessment & Plan Note (Signed)
Will start citalopram 20mg . I instructed pt to start 1/2 tablet once daily for 1 week and then increase to a full tablet once daily on week two as tolerated.  We discussed common side effects such as nausea, drowsiness and weight gain.  Also discussed rare but serious side effect of suicide ideation.  She is instructed to discontinue medication go directly to ED if this occurs.  Pt verbalizes understanding.  Plan follow up in 1 month to evaluate progress.    15 minutes spent counseling pt on anxiety and treatment.

## 2014-10-22 NOTE — Progress Notes (Signed)
   Subjective:    Patient ID: Melissa Gibson, female    DOB: 12/18/1965, 49 y.o.   MRN: [id number]  HPI  Pt presents today and complains of stress. She is caring for disabled brother.  Work is busy.  Reports anxiety has worsened over the last year.  Reports that she is having trouble sleeping.  Trying to do yoga. and stretching.  She reports occasional hot flashes.  Feels extremely tense.   Review of Systems    see HPI  Past Medical History  Diagnosis Date  . Hyperlipidemia   . Dysmenorrhea   . Menorrhagia   . Hypothyroid 10/05/10    History   Social History  . Marital Status: Married    Spouse Name: N/A  . Number of Children: N/A  . Years of Education: N/A   Occupational History  . SALES    Social History Main Topics  . Smoking status: Former Smoker -- 0.50 packs/day for 20 years    Types: Cigarettes    Quit date: 07/05/2008  . Smokeless tobacco: Never Used  . Alcohol Use: 0.0 oz/week    2-3 drink(s) per week  . Drug Use: No  . Sexual Activity: Yes    Birth Control/ Protection: Surgical     Comment: BTL   Other Topics Concern  . Not on file   Social History Narrative   Regular Exercise -  NO   Former smoker- quit 5 years ago.   internet sales   Married   No children   1 year of college   Enjoys crafts, Presenter, broadcastingyardwork.    Past Surgical History  Procedure Laterality Date  . Novasure ablation  11/2010  . Tubal ligation  2006    Family History  Problem Relation Age of Onset  . Heart attack Father   . Hypertension Father   . Heart failure Father   . Hyperlipidemia Father   . Lung cancer Sister [id number]    lung cancer  . Infertility Sister   . Thyroid disease Sister   . Cancer Sister     lung; died at 340yr  . Cancer Maternal Grandmother     lymph nodes  . COPD Mother   . Hyperlipidemia Mother   . Heart attack Brother   . COPD Brother   . Thyroid disease Sister   . Arthritis Maternal Grandfather     No Known Allergies  Current Outpatient Prescriptions  on File Prior to Visit  Medication Sig Dispense Refill  . aspirin 81 MG tablet Take 81 mg by mouth daily.    . cholecalciferol (VITAMIN D) 1000 UNITS tablet Take 1,000 Units by mouth daily.    Marland Kitchen. levothyroxine (SYNTHROID, LEVOTHROID) 75 MCG tablet TAKE 1 TABLET BY MOUTH ONCE DAILY 90 tablet 3  . Multiple Vitamin (MULTIVITAMIN) tablet Take 1 tablet by mouth daily.    Marland Kitchen. pyridOXINE (VITAMIN B-6) 100 MG tablet Take 100 mg by mouth daily.     No current facility-administered medications on file prior to visit.    BP 138/86 mmHg  Pulse 75  Temp(Src) 98.2 F (36.8 C) (Oral)  Resp 18  Ht 5' 4.5" (1.638 m)  Wt 180 lb 6.4 oz (81.829 kg)  BMI 30.50 kg/m2  SpO2 97%    Objective:   Physical Exam  Constitutional: She appears well-developed and well-nourished. No distress.  Psychiatric: Judgment and thought content normal.  tearful          Assessment & Plan:

## 2014-10-22 NOTE — Patient Instructions (Signed)
Start citalopram 1/2 tab once daily for 1 week, then increase to a full tab once daily on week two. Please schedule a follow up appointment in 1 month.

## 2014-10-22 NOTE — Progress Notes (Signed)
Pre visit review using our clinic review tool, if applicable. No additional management support is needed unless otherwise documented below in the visit note. 

## 2015-03-03 ENCOUNTER — Encounter: Payer: Self-pay | Admitting: Nurse Practitioner

## 2015-03-03 ENCOUNTER — Ambulatory Visit (INDEPENDENT_AMBULATORY_CARE_PROVIDER_SITE_OTHER): Payer: BLUE CROSS/BLUE SHIELD | Admitting: Nurse Practitioner

## 2015-03-03 VITALS — BP 126/74 | HR 68 | Ht 64.75 in | Wt 176.0 lb

## 2015-03-03 DIAGNOSIS — E039 Hypothyroidism, unspecified: Secondary | ICD-10-CM | POA: Diagnosis not present

## 2015-03-03 DIAGNOSIS — Z01419 Encounter for gynecological examination (general) (routine) without abnormal findings: Secondary | ICD-10-CM | POA: Diagnosis not present

## 2015-03-03 DIAGNOSIS — Z0000 Encounter for general adult medical examination without abnormal findings: Secondary | ICD-10-CM | POA: Diagnosis not present

## 2015-03-03 LAB — POCT URINALYSIS DIPSTICK
Bilirubin, UA: NEGATIVE
Blood, UA: NEGATIVE
GLUCOSE UA: NEGATIVE
KETONES UA: NEGATIVE
LEUKOCYTES UA: NEGATIVE
Nitrite, UA: NEGATIVE
Protein, UA: NEGATIVE
Urobilinogen, UA: NEGATIVE
pH, UA: 5.5

## 2015-03-03 LAB — TSH: TSH: 3.899 u[IU]/mL (ref 0.350–4.500)

## 2015-03-03 MED ORDER — LEVOTHYROXINE SODIUM 75 MCG PO TABS: ORAL_TABLET | ORAL | Status: DC

## 2015-03-03 NOTE — Progress Notes (Signed)
Patient ID: Melissa Gibson, female   DOB: [birth year], 49 y.o.   MRN: [id number] 49 y.o. G0P0 Married  Caucasian Fe here for annual exam.   Some hot flashes that are tolerable.  Some increase in anxiety.  This spring took Celexa for anxiety but did not help.  Will be going back to see PCP.   Patient's last menstrual period was 04/17/2013 (exact date).  Patient has had ablation.          Sexually active: Yes.    The current method of family planning is ablation.    Exercising: No.  The patient does not participate in regular exercise at present. Smoker:  no  Health Maintenance: Pap:  02/22/2012  Negative with negative HR HPV MMG:  12/28/13, Bi-Rads 1: negative - done 2 weeks ago at [facility] -ROI is done Colonoscopy: 07/08/14 diverticulosis of large intestines and hyperplastic polyp - recheck in 10 years TDaP:  10/05/2010 Labs:  HB:  13.9    Urine:  Negative    reports that she quit smoking about 6 years ago. Her smoking use included Cigarettes. She has a 10 pack-year smoking history. She has never used smokeless tobacco. She reports that she drinks alcohol. She reports that she does not use illicit drugs.  Past Medical History  Diagnosis Date  . Hyperlipidemia   . Dysmenorrhea   . Menorrhagia   . Hypothyroid 10/05/10    Past Surgical History  Procedure Laterality Date  . Novasure ablation  11/2010  . Tubal ligation  2006    Current Outpatient Prescriptions  Medication Sig Dispense Refill  . aspirin 81 MG tablet Take 81 mg by mouth daily.    . cholecalciferol (VITAMIN D) 1000 UNITS tablet Take 1,000 Units by mouth daily.    . Multiple Vitamin (MULTIVITAMIN) tablet Take 1 tablet by mouth daily.    Marland Kitchen pyridOXINE (VITAMIN B-6) 100 MG tablet Take 100 mg by mouth daily.    Marland Kitchen levothyroxine (SYNTHROID, LEVOTHROID) 88 MCG tablet Take 1 tablet (88 mcg total) by mouth daily. 90 tablet 0   No current facility-administered medications for this visit.    Family History  Problem Relation Age of Onset   . Heart attack Father   . Hypertension Father   . Heart failure Father   . Hyperlipidemia Father   . Lung cancer Sister 48    lung cancer  . Infertility Sister   . Thyroid disease Sister   . Cancer Sister     lung; died at 32yr  . Cancer Maternal Grandmother     lymph nodes  . COPD Mother   . Hyperlipidemia Mother   . Heart attack Brother   . COPD Brother   . Thyroid disease Sister   . Arthritis Maternal Grandfather     ROS:  Pertinent items are noted in HPI.  Otherwise, a comprehensive ROS was negative.  Exam:   BP 126/74 mmHg  Pulse 68  Ht 5' 4.75" (1.645 m)  Wt 176 lb (79.833 kg)  BMI 29.50 kg/m2  LMP 04/17/2013 (Exact Date) Height: 5' 4.75" (164.5 cm) Ht Readings from Last 3 Encounters:  03/03/15 5' 4.75" (1.645 m)  10/22/14 5' 4.5" (1.638 m)  02/28/14 5\' 5"  (1.651 m)    General appearance: alert, cooperative and appears stated age Head: Normocephalic, without obvious abnormality, atraumatic Neck: no adenopathy, supple, symmetrical, trachea midline and thyroid normal to inspection and palpation Lungs: clear to auscultation bilaterally Breasts: normal appearance, no masses or tenderness Heart: regular rate and rhythm  Abdomen: soft, non-tender; no masses,  no organomegaly Extremities: extremities normal, atraumatic, no cyanosis or edema Skin: Skin color, texture, turgor normal. No rashes or lesions Lymph nodes: Cervical, supraclavicular, and axillary nodes normal. No abnormal inguinal nodes palpated Neurologic: Grossly normal   Pelvic: External genitalia:  no lesions              Urethra:  normal appearing urethra with no masses, tenderness or lesions              Bartholin's and Skene's: normal                 Vagina: normal appearing vagina with normal color and discharge, no lesions              Cervix: anteverted              Pap taken: Yes.   Bimanual Exam:  Uterus:  normal size, contour, position, consistency, mobility, non-tender              Adnexa:  no mass, fullness, tenderness               Rectovaginal: Confirms               Anus:  normal sphincter tone, no lesions  Chaperone present: yes  A:  Well Woman with normal exam  S/P Novasure ablation 11/2010 S/P BTL Hypothyroid  Perimenopausal symptoms   P:   Reviewed health and wellness pertinent to exam  Pap smear as above  Mammogram is due 02/2016 if this one is normal  ROI for Dr. Loreta Ave colonoscopy 04/2014 - results are now noted  Counseled on breast self exam, mammography screening, adequate intake of calcium and vitamin D, diet and exercise return annually or prn  An After Visit Summary was printed and given to the patient.

## 2015-03-03 NOTE — Patient Instructions (Signed)

## 2015-03-04 ENCOUNTER — Other Ambulatory Visit: Payer: Self-pay | Admitting: Nurse Practitioner

## 2015-03-04 ENCOUNTER — Encounter: Payer: Self-pay | Admitting: Nurse Practitioner

## 2015-03-04 DIAGNOSIS — E039 Hypothyroidism, unspecified: Secondary | ICD-10-CM

## 2015-03-04 LAB — HEMOGLOBIN, FINGERSTICK: HEMOGLOBIN, FINGERSTICK: 13.9 g/dL (ref 12.0–16.0)

## 2015-03-04 MED ORDER — LEVOTHYROXINE SODIUM 88 MCG PO TABS: 88.0000 ug | ORAL_TABLET | Freq: Every day | ORAL | Status: DC

## 2015-03-05 NOTE — Progress Notes (Signed)
Encounter reviewed by Dr. Brook Amundson C. Silva.  

## 2015-03-06 LAB — IPS PAP TEST WITH HPV

## 2015-03-07 ENCOUNTER — Telehealth: Payer: Self-pay | Admitting: Emergency Medicine

## 2015-03-07 DIAGNOSIS — R87612 Low grade squamous intraepithelial lesion on cytologic smear of cervix (LGSIL): Secondary | ICD-10-CM

## 2015-03-07 NOTE — Telephone Encounter (Signed)
Hold for mammogram coming from [facility].

## 2015-03-11 NOTE — Telephone Encounter (Signed)
Message left to return call to [place] at [phone].   History of BTL and Ablation.   Schedule colposcopy with Dr. Edward Jolly.

## 2015-03-11 NOTE — Telephone Encounter (Deleted)
-----   Message from Ria Comment, FNP sent at 03/07/2015  9:53 AM EDT ----- Please let pt. Know that pap is abnormal and needs colpo biopsy scheduled with Dr. Edward Jolly.  - per Epic & paper chart no history of abnormal in the past.

## 2015-03-11 NOTE — Telephone Encounter (Signed)
-----   Message from Patricia Grubb, FNP sent at 03/07/2015  9:53 AM EDT ----- Please let pt. Know that pap is abnormal and needs colpo biopsy scheduled with Dr. Silva.  - per Epic & paper chart no history of abnormal in the past. 

## 2015-03-12 ENCOUNTER — Telehealth: Payer: Self-pay | Admitting: Obstetrics and Gynecology

## 2015-03-12 ENCOUNTER — Telehealth: Payer: Self-pay | Admitting: Nurse Practitioner

## 2015-03-12 NOTE — Telephone Encounter (Signed)
Spoke with patient and message from Ria Comment, FNP given. Advised pap smear abnormal and requires follow up colposcopy.  She is agreeable to scheduling colposcopy.  Brief description of procedure given to patient.  Colposcopy pre-procedure instructions given. Discussed menses and need to not have any bleeding on day of appointment, advised to call to reschedule if starts cycle- patient reports she does not have menses, hx tubal ligation and ablation.  Make sure to eat a meal and hydrate before appointment.  Advised 800 mg of Motrin PO with food one hour prior to appointment.  A   Patient verbalized understanding of preprocedure instructions.  Patient is advised she will be contacted with insurance coverage information. cc Lilyan Gilford  Patient is scheduled for colposcopy 03/14/15 at 1000 with Dr. Edward Jolly.  Ria Comment, FNP to call patient as well.   Routing to provider for final review. Patient agreeable to disposition. Will close encounter.

## 2015-03-12 NOTE — Telephone Encounter (Signed)
Pt is informed of HPV status on pap smear.  She had this in college and is aware of HPV.  Will be here on Friday for Colpo Biopsy.

## 2015-03-12 NOTE — Telephone Encounter (Signed)
Spoke with patient. Reviewed benefit for procedure. Patient understood and agreeable. Verified arrival date/time for procedure. Patient agreeable. Ok to close. °

## 2015-03-12 NOTE — Telephone Encounter (Signed)
I have called pt. To explain more about her abnormal pap smear.  She is not in a good position to talk at this moment and will call me back this afternoon.

## 2015-03-14 ENCOUNTER — Ambulatory Visit (INDEPENDENT_AMBULATORY_CARE_PROVIDER_SITE_OTHER): Payer: BLUE CROSS/BLUE SHIELD | Admitting: Obstetrics and Gynecology

## 2015-03-14 ENCOUNTER — Encounter: Payer: Self-pay | Admitting: Obstetrics and Gynecology

## 2015-03-14 DIAGNOSIS — R87612 Low grade squamous intraepithelial lesion on cytologic smear of cervix (LGSIL): Secondary | ICD-10-CM | POA: Diagnosis not present

## 2015-03-14 NOTE — Patient Instructions (Signed)

## 2015-03-14 NOTE — Progress Notes (Signed)
Subjective:     Patient ID: Melissa Gibson, female   DOB: 08/28/1965, 49 y.o.   MRN: [id number]  HPI  Patient here today for colposcopy due to abnormal pap smear of LSIL:Pos HR HPV on 03-03-15.  History of Novasure ablation 2012.   Patient states she had an abnormal pap when she was 56 but unsure if any treatment.  She thinks may have had cryotherapy to cervix.   Review of Systems  LMP:  04-17-13 Contraception:  Tubal Ligation, Ablation      Objective:   Physical Exam  Genitourinary:        Colposcopy.  Consent for procedure.  Speculum placed in vagina.  3% acetic acid placed.  Colposcopy satisfactory.  Mild subtle acetowhite changes at 3:00 and 9:00.  No punctations or mosaics. ECC done and sent to pathology.  Biopsy of the exocervix at 3:00 sent to pathology.  Minimal tissue obtained. Different biopsy forceps used. Biopsy of the exocervix at 9:00 sent to pathology.  Minimal tissue obtained. Different biopsy forceps used.  Monsel's placed.  Minimal EBL.  No complications.  Patient was told that it was difficult to biopsy her cervix today.      Assessment:     LGSIL pap and positive HR HPV.    Plan:     Instructions and precautions given.  Follow up biopsies.  If biopsies and ECC are negative, I would consider doing a pap and HR HPV testing in 6 months instead of waiting one year.  After visit summary to patient.

## 2015-03-18 LAB — IPS OTHER TISSUE BIOPSY

## 2015-03-20 ENCOUNTER — Telehealth: Payer: Self-pay | Admitting: Emergency Medicine

## 2015-03-20 NOTE — Telephone Encounter (Signed)
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 There was no sign of cancer.  I am recommending a LEEP procedure.  Will you please schedule an appointment so I can discuss this more adequately with the patient.  We were expecting no treatment at this time.   Cc- Amanda Dixon 

## 2015-03-20 NOTE — Telephone Encounter (Signed)
Call to patient and message from Dr. Edward Jolly given.  Patient verbalized understanding of results and consult appointment scheduled for 03/24/15 at 0830. Patient agreeable and will follow up as scheduled. Routing to provider for final review. Patient agreeable to disposition. Will close encounter.

## 2015-03-24 ENCOUNTER — Ambulatory Visit (INDEPENDENT_AMBULATORY_CARE_PROVIDER_SITE_OTHER): Payer: BLUE CROSS/BLUE SHIELD | Admitting: Obstetrics and Gynecology

## 2015-03-24 ENCOUNTER — Encounter: Payer: Self-pay | Admitting: Obstetrics and Gynecology

## 2015-03-24 VITALS — BP 122/80 | HR 70 | Resp 14 | Wt 178.0 lb

## 2015-03-24 DIAGNOSIS — N871 Moderate cervical dysplasia: Secondary | ICD-10-CM

## 2015-03-24 NOTE — Patient Instructions (Signed)
Loop Electrosurgical Excision Procedure Loop electrosurgical excision procedure (LEEP) is the removal of a portion of the lower part of the uterus (cervix). The procedure is done when there are significantly abnormal cervical cell changes. Abnormal cell changes of the cervix can lead to cancer if left in place and untreated.  The LEEP procedure itself typically only takes a few minutes. Often, it may be done in your caregiver's office. The procedure is considered safe for those who wish to get pregnant or are trying to get pregnant. Only under rare circumstances should this procedure be done if you are pregnant. LET YOUR CAREGIVER KNOW ABOUT:  Whether you are pregnant or late for your last menstrual period.  Allergies to foods or medicines.  All the medicines you are taking includingherbs, eyedrops, and over-the-counter medicines, and creams.  Use of steroids (by mouth or creams).  Previous problems with anesthetics or numbing medicine.  Previous gynecological surgery.  History of blood clots or bleeding problems.  Any recent or current vaginal infections (herpes, sexually transmitted infections).  Other health problems. RISKS AND COMPLICATIONS  Bleeding.  Infection.  Injury to the vagina, bladder, or rectum.  Very rare obstruction of the cervical opening that causes problems during menstruation (cervical stenosis). BEFORE THE PROCEDURE  Do not take aspirin or blood thinners (anticoagulants) for 1 week before the procedure, or as told by your caregiver.  Eat a light meal before the procedure.  Ask your caregiver about changing or stopping your regular medicines.  You may be given a pain reliever 1 or 2 hours before the procedure. PROCEDURE   A tool (speculum) is placed in the vagina. This allows your caregiver to see the cervix.  An iodine stain is applied to the cervix to find the area of abnormal cells to be removed.  Medicine is injected to numb the cervix (local  anesthetic).   Electricity is passed through a thin wire loop which is then used to remove (cauterize) a small segment of the affected cervix.  Light electrocautery is used to seal any small blood vessels and prevent bleeding.  A paste may be applied to the cauterized area of the cervix to help prevent bleeding.  The tissue sample is sent to the lab. It is examined under the microscope. AFTER THE PROCEDURE  Have someone drive you home.  You may have slight to moderate cramping.  You may notice a black vaginal discharge from the paste used on the cervix to prevent bleeding. This is normal.  Watch for excessive bleeding. This requires immediate medical care.  Ask when your test results will be ready. Make sure you get your test results. Document Released: 09/11/2002 Document Revised: 09/13/2011 Document Reviewed: 12/01/2010 [employer] Patient Information 2015 [employer]. This information is not intended to replace advice given to you by your health care provider. Make sure you discuss any questions you have with your health care provider.  

## 2015-03-24 NOTE — Progress Notes (Signed)
GYNECOLOGY  VISIT   HPI: 49 y.o.   Married  Caucasian  female   G0P0 with Patient's last menstrual period was 04/17/2013 (exact date).   here for  Colposcopy Consult. Pap showed LGSIL and postivie HR HPV on 03/03/15.  Colposcopy on 03/14/15 showed: Satisfactory.  ECC negative.  Biopsy - HGSIL.   GYNECOLOGIC HISTORY: Patient's last menstrual period was 04/17/2013 (exact date). Contraception: none Menopausal hormone therapy: none Last mammogram: 12/28/13 3D Dense Category a, Bi-rads C 1 neg. Last pap smear: 03/03/15 LSIL, Postive HPV        OB History    Gravida Para Term Preterm AB TAB SAB Ectopic Multiple Living   0                  Patient Active Problem List   Diagnosis Date Noted  . Anxiety state 10/22/2014  . Left hip pain 01/20/2014  . Lateral epicondylitis  of elbow 12/27/2011  . Melasma 12/27/2011  . HYPOTHYROIDISM 10/08/2008  . HYPERLIPIDEMIA 10/08/2008    Past Medical History  Diagnosis Date  . Hyperlipidemia   . Dysmenorrhea   . Menorrhagia   . Hypothyroid 10/05/10  . Abnormal Pap smear of cervix     -age 34/unsure if she had any treatment/03-03-15 LSIL:Pos HR HPV    Past Surgical History  Procedure Laterality Date  . Novasure ablation  11/2010  . Tubal ligation  2006  . Colonoscopy w/ biopsies  07/10/2014    Diverticulosis of large intestines and hyperplastic ployp X 1 recheck in 10 yrs.    Current Outpatient Prescriptions  Medication Sig Dispense Refill  . aspirin 81 MG tablet Take 81 mg by mouth daily.    . cholecalciferol (VITAMIN D) 1000 UNITS tablet Take 1,000 Units by mouth daily.    Marland Kitchen levothyroxine (SYNTHROID, LEVOTHROID) 88 MCG tablet Take 1 tablet (88 mcg total) by mouth daily. 90 tablet 0  . Multiple Vitamin (MULTIVITAMIN) tablet Take 1 tablet by mouth daily.    Marland Kitchen pyridOXINE (VITAMIN B-6) 100 MG tablet Take 100 mg by mouth daily.     No current facility-administered medications for this visit.     ALLERGIES: Review of patient's allergies  indicates no known allergies.  Family History  Problem Relation Age of Onset  . Heart attack Father   . Hypertension Father   . Heart failure Father   . Hyperlipidemia Father   . Lung cancer Sister 44    lung cancer  . Infertility Sister   . Thyroid disease Sister   . Cancer Sister     lung; died at 38yr  . Cancer Maternal Grandmother     lymph nodes  . COPD Mother   . Hyperlipidemia Mother   . Heart attack Brother   . COPD Brother   . Thyroid disease Sister   . Arthritis Maternal Grandfather     Social History   Social History  . Marital Status: Married    Spouse Name: N/A  . Number of Children: N/A  . Years of Education: N/A   Occupational History  . SALES    Social History Main Topics  . Smoking status: Former Smoker -- 0.50 packs/day for 20 years    Types: Cigarettes    Quit date: 07/05/2008  . Smokeless tobacco: Never Used  . Alcohol Use: 1.2 - 1.8 oz/week    2-3 Standard drinks or equivalent per week  . Drug Use: No  . Sexual Activity:    Partners: Male    Birth Control/  Protection: Surgical     Comment: BTL   Other Topics Concern  . Not on file   Social History Narrative   Regular Exercise -  NO   Former smoker- quit 5 years ago.   internet sales   Married   No children   1 year of college   Enjoys crafts, Presenter, broadcasting.    ROS:  Pertinent items are noted in HPI.  PHYSICAL EXAMINATION:    LMP 04/17/2013 (Exact Date)    General appearance: alert, cooperative and appears stated age  ASSESSMENT  HGSIL. Positive HR HPV.  PLAN  Counseled regarding HPV, cervical dysplasia, colposcopy and LEEP procedure.  Benefits and risks of LEEP reviewed - bleeding, damage to surrounding organs, cervical scarring making future evaluation difficult. Discussed minimum of yearly paps for 20 years following LEEP.  Discussed that this procedure does not remove or eliminate HPV.  Will precert and then schedule.   An After Visit Summary was printed and given to  the patient.  ___15___ minutes face to face time of which over 50% was spent in counseling.

## 2015-03-25 ENCOUNTER — Telehealth: Payer: Self-pay | Admitting: Emergency Medicine

## 2015-03-25 NOTE — Telephone Encounter (Signed)
Reviewed benefit with patient. She understands and is agreeable. Patient requested some times on a Friday. Transferred to [facility] for scheduling.

## 2015-03-25 NOTE — Telephone Encounter (Signed)
Patient scheduled for LEEP in office with Dr. Edward Jolly 04/10/15 at 1500.  Pre-procedure instructions given. Motrin 800 mg PO one hour before appointment with food and water. Patient verbalized understanding.  She will call back as needed prior to appointment.  Routing to Yazmin Locher for final review. Patient agreeable to disposition. Will close encounter.

## 2015-03-25 NOTE — Telephone Encounter (Signed)
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 There was no sign of cancer.  I am recommending a LEEP procedure.  Will you please schedule an appointment so I can discuss this more adequately with the patient.  We were expecting no treatment at this time.   Cc- Claudette Laws

## 2015-04-03 ENCOUNTER — Telehealth: Payer: Self-pay

## 2015-04-03 NOTE — Telephone Encounter (Signed)
Spoke with patient. Patient is scheduled for a LEEP procedure on 04/10/2015. Dr.Silva will be in surgery this day and appointment needs to be rescheduled. Patient is agreeable. Contraception is tubal ligation and ablation. Requesting a Monday or Thursday appointment (first available). Appointment rescheduled to 04/14/2015 at 3pm with Dr.Silva. Patient is agreeable to date and time.   Routing to provider for final review. Patient agreeable to disposition. Will close encounter.

## 2015-04-10 ENCOUNTER — Ambulatory Visit: Payer: BLUE CROSS/BLUE SHIELD | Admitting: Obstetrics and Gynecology

## 2015-04-14 ENCOUNTER — Encounter: Payer: Self-pay | Admitting: Obstetrics and Gynecology

## 2015-04-14 ENCOUNTER — Ambulatory Visit (INDEPENDENT_AMBULATORY_CARE_PROVIDER_SITE_OTHER): Payer: BLUE CROSS/BLUE SHIELD | Admitting: Obstetrics and Gynecology

## 2015-04-14 DIAGNOSIS — N871 Moderate cervical dysplasia: Secondary | ICD-10-CM

## 2015-04-14 HISTORY — PX: LEEP: SHX91

## 2015-04-14 NOTE — Patient Instructions (Signed)
Loop Electrosurgical Excision Procedure, Care After Refer to this sheet in the next few weeks. These instructions provide you with information on caring for yourself after your procedure. Your caregiver may also give you more specific instructions. Your treatment has been planned according to current medical practices, but problems sometimes occur. Call your caregiver if you have any problems or questions after your procedure. HOME CARE INSTRUCTIONS   Do not use tampons, douche, or have sexual intercourse for 2 weeks or as directed by your caregiver.  Begin normal activities if you have no or minimal cramping or bleeding, unless directed otherwise by your caregiver.  Take your temperature if you feel sick. Write down your temperature on paper, and tell your caregiver if you have a fever.  Take all medicines as directed by your caregiver.  Keep all your follow-up appointments and Pap tests as directed by your caregiver. SEEK IMMEDIATE MEDICAL CARE IF:   You have bleeding that is heavier or longer than a normal menstrual cycle.  You have bleeding that is bright red.  You have blood clots.  You have a fever.  You have increasing cramps or pain not relieved by medicine.  You develop abdominal pain that does not seem to be related to the same area of earlier cramping and pain.  You are lightheaded, unusually weak, or faint.  You develop painful or bloody urination.  You develop a bad smelling vaginal discharge. MAKE SURE YOU:  Understand these instructions.  Will watch your condition.  Will get help right away if you are not doing well or get worse.   This information is not intended to replace advice given to you by your health care provider. Make sure you discuss any questions you have with your health care provider.   Document Released: 03/04/2011 Document Revised: 07/12/2014 Document Reviewed: 03/04/2011 Elsevier Interactive Patient Education 2016 Elsevier Inc.  

## 2015-04-14 NOTE — Progress Notes (Signed)
Subjective:     Patient ID: Melissa Gibson, female   DOB: [birth year], 49 y.o.   MRN: [id number]  HPI  Patient here today for a LEEP Procedure.  Pap smear 03-03-15 LSIL:Pos HR HPV;colposcopy CIN II with neg ECC.  Usually takes a baby ASA daily, but not today.  Took Ibuprofen 800 mg today.   Review of Systems  LMP: 04-17-13 Contraception:Ablation/Postmenopausal     Objective:   Physical Exam    LEEP procedure.  Consent for procedure.  Patient grounded. Speculum placed in the vagina.  3% acetic acid placed.  Acetowhite change at 6:00 on cervix.  Lugol's placed and demonstrated decreased uptake at 6:00 position of cervix.  20 cc of 2% lidocaine with epi 1:100,000 circumferentially around the cervix. Lot [id number], exp 08/2015. LEEP performed in 2 passes with cutting setting of 60 watts.  Exocervix pass - not oriented.  Sent to pathology. Endocervix pass - marked at 12:00 with yellow pin.  Sent to pathology. Coagulation of perimeter of LEEP bed with coagulation ball, setting of 60 watts.  Good hemostasis.  Monsel's placed.  Speculum and grounding pad removed. Minimal EBL.  No complications.  Tolerated well.       Assessment:     CIN II. LEEP performed.     Plan:     Instructions and precautions given.  Follow up biopsy results.  Anticipate cotesting in 12 months but will need to await final pathology results.   After visit summary to patient.

## 2015-04-17 LAB — IPS OTHER TISSUE BIOPSY

## 2015-04-21 ENCOUNTER — Telehealth: Payer: Self-pay | Admitting: Emergency Medicine

## 2015-04-21 NOTE — Telephone Encounter (Signed)
-----   Message from Patton SallesBrook E Amundson C Silva, MD sent at 04/18/2015  8:12 AM EDT ----- Please report pathology results from LEEP to patient:  LGSIL with negative margins.  No residual HGSIL was seen.  No cancer seen.   I am recommending post procedure check in 4 weeks and then cotesting in 12 months. Please place 08 recall.   Cc - Claudette LawsAmanda Dixon

## 2015-04-21 NOTE — Telephone Encounter (Signed)
Spoke with patient and message from Dr. Edward JollySilva given. Patient verbalized understanding of results and follow up necessary.  Patient verbalized understanding and will follow up in 4 weeks on 05/12/15 at 1530 as scheduled and 08 recall entered for 12 months co-testing.   Routing to provider for final review. Patient agreeable to disposition. Will close encounter.

## 2015-05-12 ENCOUNTER — Encounter: Payer: Self-pay | Admitting: Obstetrics and Gynecology

## 2015-05-12 ENCOUNTER — Ambulatory Visit (INDEPENDENT_AMBULATORY_CARE_PROVIDER_SITE_OTHER): Payer: BLUE CROSS/BLUE SHIELD | Admitting: Obstetrics and Gynecology

## 2015-05-12 VITALS — BP 138/70 | HR 86 | Ht 64.75 in | Wt 184.0 lb

## 2015-05-12 DIAGNOSIS — N898 Other specified noninflammatory disorders of vagina: Secondary | ICD-10-CM

## 2015-05-12 DIAGNOSIS — N870 Mild cervical dysplasia: Secondary | ICD-10-CM

## 2015-05-12 MED ORDER — ESTROGENS, CONJUGATED 0.625 MG/GM VA CREA: TOPICAL_CREAM | VAGINAL | Status: DC

## 2015-05-12 NOTE — Progress Notes (Signed)
Patient ID: Melissa Gibson, female   DOB: 02-03-1966, 49 y.o.   MRN: [id number] GYNECOLOGY  VISIT   HPI: 49 y.o.   Married  Caucasian  female   G0P0 with No LMP recorded. Patient has had an ablation.   here for 4 week follow up from LEEP procedure.   No problems with bleeding or pain.   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   GYNECOLOGIC HISTORY: No LMP recorded. Patient has had an ablation. Contraception:None/ablation Menopausal hormone therapy: n/a Last mammogram: this year through work - [facility]?    OB History    Gravida Para Term Preterm AB TAB SAB Ectopic Multiple Living   0                  Patient Active Problem List   Diagnosis Date Noted  . Anxiety state 10/22/2014  . Left hip pain 01/20/2014  . Lateral epicondylitis  of elbow 12/27/2011  . Melasma 12/27/2011  . HYPOTHYROIDISM 10/08/2008  . HYPERLIPIDEMIA 10/08/2008    Past Medical History  Diagnosis Date  . Hyperlipidemia   . Dysmenorrhea   . Menorrhagia   . Hypothyroid 10/05/10  . Abnormal Pap smear of cervix     -age 31/unsure if she had any treatment/03-03-15 LSIL:Pos HR HPV;LEEP 04-14-15 LGSIL w/neg margins    Past Surgical History  Procedure Laterality Date  . Novasure ablation  11/2010  . Tubal ligation  2006  . Colonoscopy w/ biopsies  07/10/2014    Diverticulosis of large intestines and hyperplastic ployp X 1 recheck in 10 yrs.  . Leep  04/14/15    CIN I    Current Outpatient Prescriptions  Medication Sig Dispense Refill  . aspirin 81 MG tablet Take 81 mg by mouth daily.    . cholecalciferol (VITAMIN D) 1000 UNITS tablet Take 1,000 Units by mouth daily.    Marland Kitchen levothyroxine (SYNTHROID, LEVOTHROID) 88 MCG tablet Take 1 tablet (88 mcg total) by mouth daily. 90 tablet 0  . Multiple Vitamin (MULTIVITAMIN) tablet Take 1 tablet by mouth daily.    Marland Kitchen pyridOXINE (VITAMIN B-6) 100 MG tablet Take 100 mg by mouth daily.    Marland Kitchen conjugated estrogens (PREMARIN) vaginal cream Use 1/2 g vaginally every night at bed  time for the next 10 nights. 30 g 1   No current facility-administered medications for this visit.     ALLERGIES: Review of patient's allergies indicates no known allergies.  Family History  Problem Relation Age of Onset  . Heart attack Father   . Hypertension Father   . Heart failure Father   . Hyperlipidemia Father   . Lung cancer Sister 4    lung cancer  . Infertility Sister   . Thyroid disease Sister   . Cancer Sister     lung; died at 35yr  . Cancer Maternal Grandmother     lymph nodes  . COPD Mother   . Hyperlipidemia Mother   . Heart attack Brother   . COPD Brother   . Thyroid disease Sister   . Arthritis Maternal Grandfather     Social History   Social History  . Marital Status: Married    Spouse Name: N/A  . Number of Children: N/A  . Years of Education: N/A   Occupational History  . SALES    Social History Main Topics  . Smoking status: Former Smoker -- 0.50 packs/day for 20 years    Types: Cigarettes    Quit date: 07/05/2008  . Smokeless tobacco: Never  Used  . Alcohol Use: 1.2 - 1.8 oz/week    2-3 Standard drinks or equivalent per week  . Drug Use: No  . Sexual Activity:    Partners: Male    Birth Control/ Protection: Surgical     Comment: BTL   Other Topics Concern  . Not on file   Social History Narrative   Regular Exercise -  NO   Former smoker- quit 5 years ago.   internet sales   Married   No children   1 year of college   Enjoys crafts, Presenter, broadcastingyardwork.    ROS:  Pertinent items are noted in HPI.  PHYSICAL EXAMINATION:    BP 138/70 mmHg  Pulse 86  Ht 5' 4.75" (1.645 m)  Wt 184 lb (83.462 kg)  BMI 30.84 kg/m2    General appearance: alert, cooperative and appears stated age   Pelvic: External genitalia:  no lesions              Urethra:  normal appearing urethra with no masses, tenderness or lesions              Bartholins and Skenes: normal                 Vagina: normal appearing vagina with normal color and discharge, right  vaginal apex with 0.5 cm beefy red lesion, nonraised.               Cervix:  Consistent with LEEP.  Well healed.  Bimanual Exam:  Uterus:  normal size, contour, position, consistency, mobility, non-tender              Adnexa: normal adnexa and no mass, fullness, tenderness                Chaperone was present for exam.  ASSESSMENT  Status post LEEP procedure for CIN II.  Final pathology CIN I. Right vaginal wall lesion.  PLAN  Counseled regarding CIN I on cervical LEEP specimen.  Plan for cotesting in 12 months and 24 months.  Will treat with vaginal estrogen cream, Premarin,  1/2 gram pv at hs for 10 nights and then follow up in 2 weeks for a recheck. Discussion of estrogenic effects of potential DVT, PE, MI, stroke, breast cancer.  If lesion is still present, will do vaginal wall biopsy.  Will request copy of her mammogram for our records.   An After Visit Summary was printed and given to the patient.  _15_____ minutes face to face time of which over 50% was spent in counseling.

## 2015-05-13 ENCOUNTER — Telehealth: Payer: Self-pay | Admitting: Obstetrics and Gynecology

## 2015-05-13 MED ORDER — ESTROGENS, CONJUGATED 0.625 MG/GM VA CREA: TOPICAL_CREAM | VAGINAL | Status: DC

## 2015-05-13 NOTE — Telephone Encounter (Signed)
I resent the Premarin to [facility] -eh

## 2015-05-13 NOTE — Telephone Encounter (Signed)
[facility] calling regarding escript that was sent 05/12/15.  [facility] pharmacy is asking that the escript be sent again. [facility] is unable to see request.

## 2015-05-15 ENCOUNTER — Encounter: Payer: Self-pay | Admitting: Obstetrics and Gynecology

## 2015-05-16 ENCOUNTER — Ambulatory Visit: Payer: BLUE CROSS/BLUE SHIELD | Admitting: Obstetrics and Gynecology

## 2015-05-16 ENCOUNTER — Telehealth: Payer: Self-pay | Admitting: Obstetrics and Gynecology

## 2015-05-16 NOTE — Telephone Encounter (Signed)
Called patient to review benefits for procedure. Left voicemail to call back and review. °

## 2015-05-20 NOTE — Telephone Encounter (Signed)
Reviewed benefit for procedure. Patient agreeable. Patient scheduled for 05/26/15. Patient agreeable to arrival date/time. No further questions. Ok to close.

## 2015-05-26 ENCOUNTER — Encounter: Payer: Self-pay | Admitting: Obstetrics and Gynecology

## 2015-05-26 ENCOUNTER — Ambulatory Visit (INDEPENDENT_AMBULATORY_CARE_PROVIDER_SITE_OTHER): Payer: BLUE CROSS/BLUE SHIELD | Admitting: Obstetrics and Gynecology

## 2015-05-26 DIAGNOSIS — N898 Other specified noninflammatory disorders of vagina: Secondary | ICD-10-CM

## 2015-05-26 NOTE — Progress Notes (Signed)
GYNECOLOGY  VISIT   HPI: 49 y.o.   Married  Caucasian  female   G0P0 with No LMP recorded. Patient has had an ablation.   here for Follow up vaginal lesion      Used Premarin cream for 10 days.    GYNECOLOGIC HISTORY: No LMP recorded. Patient has had an ablation. Contraception: Ablation  Menopausal hormone therapy: None Last mammogram: 12/28/13 BIRADS1:Neg Last pap smear: 03/03/15 CIN I. HR HPV:+Detected. LEEP LGSIL Neg Margins         OB History    Gravida Para Term Preterm AB TAB SAB Ectopic Multiple Living   0                  Patient Active Problem List   Diagnosis Date Noted  . Anxiety state 10/22/2014  . Left hip pain 01/20/2014  . Lateral epicondylitis  of elbow 12/27/2011  . Melasma 12/27/2011  . HYPOTHYROIDISM 10/08/2008  . HYPERLIPIDEMIA 10/08/2008    Past Medical History  Diagnosis Date  . Hyperlipidemia   . Dysmenorrhea   . Menorrhagia   . Hypothyroid 10/05/10  . Abnormal Pap smear of cervix     -age 10/unsure if she had any treatment/03-03-15 LSIL:Pos HR HPV;LEEP 04-14-15 LGSIL w/neg margins    Past Surgical History  Procedure Laterality Date  . Novasure ablation  11/2010  . Tubal ligation  2006  . Colonoscopy w/ biopsies  07/10/2014    Diverticulosis of large intestines and hyperplastic ployp X 1 recheck in 10 yrs.  . Leep  04/14/15    CIN I    Current Outpatient Prescriptions  Medication Sig Dispense Refill  . aspirin 81 MG tablet Take 81 mg by mouth daily.    . cholecalciferol (VITAMIN D) 1000 UNITS tablet Take 1,000 Units by mouth daily.    Marland Kitchen. levothyroxine (SYNTHROID, LEVOTHROID) 88 MCG tablet Take 1 tablet (88 mcg total) by mouth daily. 90 tablet 0  . Multiple Vitamin (MULTIVITAMIN) tablet Take 1 tablet by mouth daily.    Marland Kitchen. pyridOXINE (VITAMIN B-6) 100 MG tablet Take 100 mg by mouth daily.     No current facility-administered medications for this visit.     ALLERGIES: Review of patient's allergies indicates no known allergies.  Family  History  Problem Relation Age of Onset  . Heart attack Father   . Hypertension Father   . Heart failure Father   . Hyperlipidemia Father   . Lung cancer Sister [id number]    lung cancer  . Infertility Sister   . Thyroid disease Sister   . Cancer Sister     lung; died at 1561yr  . Cancer Maternal Grandmother     lymph nodes  . COPD Mother   . Hyperlipidemia Mother   . Heart attack Brother   . COPD Brother   . Thyroid disease Sister   . Arthritis Maternal Grandfather     Social History   Social History  . Marital Status: Married    Spouse Name: N/A  . Number of Children: N/A  . Years of Education: N/A   Occupational History  . SALES    Social History Main Topics  . Smoking status: Former Smoker -- 0.50 packs/day for 20 years    Types: Cigarettes    Quit date: 07/05/2008  . Smokeless tobacco: Never Used  . Alcohol Use: 1.2 - 1.8 oz/week    2-3 Standard drinks or equivalent per week  . Drug Use: No  . Sexual Activity:    Partners: Male  Birth Control/ Protection: Surgical     Comment: BTL   Other Topics Concern  . Not on file   Social History Narrative   Regular Exercise -  NO   Former smoker- quit 5 years ago.   internet sales   Married   No children   1 year of college   Enjoys crafts, Presenter, broadcasting.    ROS:  Pertinent items are noted in HPI.  PHYSICAL EXAMINATION:    There were no vitals taken for this visit.    General appearance: alert, cooperative and appears stated age.    Pelvic: External genitalia:  no lesions              Urethra:  normal appearing urethra with no masses, tenderness or lesions              Bartholins and Skenes: normal                 Vagina: normal appearing vagina with normal color and discharge, no lesions              Cervix: no lesions and consistent with LEEP.           Bimanual Exam:  Uterus:  normal size, contour, position, consistency, mobility, non-tender              Adnexa: normal adnexa and no mass, fullness, tenderness        Colposcopy done of vagina and the cervix using 3% acetic acid after verbal consent.  No lesions of the cervix or the vagina.  No biopsy taken.   Chaperone was present for exam.  ASSESSMENT  Status post LEEP.  Vaginal lesion resolved.  No sign of dysplasia on exam.  Possible atrophy changes caused this.    PLAN  Counseled regarding use of Premarin vaginal cream 1/2 gm pv at hs twice weekly.  Discussed risks of thromboembolic events and breast cancer.  Will get report from mammogram and send to [place] at the office.  Follow up for yearly cotesting of cervix.    An After Visit Summary was printed and given to the patient.  ___15___ minutes face to face time of which over 50% was spent in counseling.

## 2015-05-26 NOTE — Patient Instructions (Signed)
Please get a copy of mammogram from 2016 from mobile unit at work - [facility].  Thank you!

## 2015-06-23 ENCOUNTER — Other Ambulatory Visit: Payer: Self-pay | Admitting: Nurse Practitioner

## 2015-06-23 MED ORDER — LEVOTHYROXINE SODIUM 88 MCG PO TABS: 88.0000 ug | ORAL_TABLET | Freq: Every day | ORAL | Status: DC

## 2015-11-17 DIAGNOSIS — L309 Dermatitis, unspecified: Secondary | ICD-10-CM | POA: Diagnosis not present

## 2015-12-19 DIAGNOSIS — L281 Prurigo nodularis: Secondary | ICD-10-CM | POA: Diagnosis not present

## 2015-12-19 DIAGNOSIS — L710 Perioral dermatitis: Secondary | ICD-10-CM | POA: Diagnosis not present

## 2016-03-04 ENCOUNTER — Encounter: Payer: Self-pay | Admitting: Nurse Practitioner

## 2016-03-04 NOTE — Progress Notes (Signed)
Patient ID: Melissa Gibson A Brossard, female   DOB: 04/12/1966, 50 y.o.   MRN: [id number]  50 y.o. G0P0000 Married  Caucasian Fe here for annual exam. No new diagnosis this past year.  She is anxious over pap this year due to abnormal pap last year.  Patient's last menstrual period was 04/17/2013 (exact date).          Sexually active: Yes.    The current method of family planning is tubal ligation and ablation.    Exercising: No.  The patient does not participate in regular exercise at present. Smoker:  no  Health Maintenance: Pap: 03/03/15, LSIL with pos HR HPV; Colpo 03/14/15, CIN-II; LEEP 04/15/15, LGSIL with neg margins MMG: 02/20/15, Bi-Rads 1: Negative  Colonoscopy: 07/08/14 diverticulosis of large intestines and hyperplastic polyp - recheck in 10 years TDaP:  10/05/2010 Hep C and HIV: will be done at work Labs: HB: 15.1   Urine: Negative   reports that she quit smoking about 7 years ago. Her smoking use included Cigarettes. She has a 10.00 pack-year smoking history. She has never used smokeless tobacco. She reports that she drinks about 1.2 - 1.8 oz of alcohol per week . She reports that she does not use drugs.  Past Medical History:  Diagnosis Date  . Abnormal Pap smear of cervix    -age 32/unsure if she had any treatment/03-03-15 LSIL:Pos HR HPV;LEEP 04-14-15 LGSIL w/neg margins  . Dysmenorrhea   . Hyperlipidemia   . Hypothyroid 10/05/10  . Menorrhagia     Past Surgical History:  Procedure Laterality Date  . COLONOSCOPY W/ BIOPSIES  07/10/2014   Diverticulosis of large intestines and hyperplastic ployp X 1 recheck in 10 yrs.  Marland Kitchen. LEEP  04/14/15   CIN I  . NOVASURE ABLATION  11/2010  . TUBAL LIGATION  2006    Current Outpatient Prescriptions  Medication Sig Dispense Refill  . aspirin 81 MG tablet Take 81 mg by mouth daily.    . cholecalciferol (VITAMIN D) 1000 UNITS tablet Take 1,000 Units by mouth daily.    Marland Kitchen. levothyroxine (SYNTHROID, LEVOTHROID) 100 MCG tablet Take 100 mcg by mouth daily  before breakfast.    . Multiple Vitamin (MULTIVITAMIN) tablet Take 1 tablet by mouth daily.    Marland Kitchen. pyridOXINE (VITAMIN B-6) 100 MG tablet Take 100 mg by mouth daily.     No current facility-administered medications for this visit.     Family History  Problem Relation Age of Onset  . Heart attack Father   . Hypertension Father   . Heart failure Father   . Hyperlipidemia Father   . Lung cancer Sister [id number]    lung cancer  . Infertility Sister   . Thyroid disease Sister   . Cancer Sister     lung; died at 8260yr  . COPD Mother   . Hyperlipidemia Mother   . Heart attack Brother   . COPD Brother   . Thyroid disease Sister   . Cancer Maternal Grandmother     lymph nodes  . Arthritis Maternal Grandfather     ROS:  Pertinent items are noted in HPI.  Otherwise, a comprehensive ROS was negative.  Exam:   BP (!) 134/92 (BP Location: Right Arm, Patient Position: Sitting, Cuff Size: Normal)   Pulse 76   Ht 5' 4.75" (1.645 m)   Wt 171 lb (77.6 kg)   LMP 04/17/2013 (Exact Date)   BMI 28.68 kg/m  Height: 5' 4.75" (164.5 cm) Ht Readings from Last 3 Encounters:  03/05/16 5' 4.75" (1.645 m)  05/26/15 5' 4.75" (1.645 m)  05/12/15 5' 4.75" (1.645 m)    General appearance: alert, cooperative and appears stated age Head: Normocephalic, without obvious abnormality, atraumatic Neck: no adenopathy, supple, symmetrical, trachea midline and thyroid normal to inspection and palpation Lungs: clear to auscultation bilaterally Breasts: normal appearance, no masses or tenderness Heart: regular rate and rhythm Abdomen: soft, non-tender; no masses,  no organomegaly Extremities: extremities normal, atraumatic, no cyanosis or edema Skin: Skin color, texture, turgor normal. No rashes or lesions Lymph nodes: Cervical, supraclavicular, and axillary nodes normal. No abnormal inguinal nodes palpated Neurologic: Grossly normal   Pelvic: External genitalia:  no lesions              Urethra:  normal  appearing urethra with no masses, tenderness or lesions              Bartholin's and Skene's: normal                 Vagina: normal appearing vagina with normal color and discharge, no lesions              Cervix: anteverted              Pap taken: Yes.   Bimanual Exam:  Uterus:  normal size, contour, position, consistency, mobility, non-tender              Adnexa: no mass, fullness, tenderness               Rectovaginal: Confirms               Anus:  normal sphincter tone, no lesions  Chaperone present: yes  A:  Well Woman with normal exam  S/P Novasure ablation 11/2010 S/P BTL Hypothyroid  Perimenopausal symptoms  History of abnormal pap with + HR HPV, colpo 9/16 CIN II with LEEP  Elevated BP   P:   Reviewed health and wellness pertinent to exam  Pap smear done today 08  Mammogram is due now and will schedule  Follow up with BP at work and with labs at work  Counseled on breast self exam, mammography screening, adequate intake of calcium and vitamin D, diet and exercise return annually or prn  An After Visit Summary was printed and given to the patient.

## 2016-03-05 ENCOUNTER — Encounter: Payer: Self-pay | Admitting: Nurse Practitioner

## 2016-03-05 ENCOUNTER — Ambulatory Visit (INDEPENDENT_AMBULATORY_CARE_PROVIDER_SITE_OTHER): Payer: BLUE CROSS/BLUE SHIELD | Admitting: Nurse Practitioner

## 2016-03-05 VITALS — BP 134/92 | HR 76 | Ht 64.75 in | Wt 171.0 lb

## 2016-03-05 DIAGNOSIS — Z1151 Encounter for screening for human papillomavirus (HPV): Secondary | ICD-10-CM | POA: Diagnosis not present

## 2016-03-05 DIAGNOSIS — N871 Moderate cervical dysplasia: Secondary | ICD-10-CM | POA: Diagnosis not present

## 2016-03-05 DIAGNOSIS — Z01419 Encounter for gynecological examination (general) (routine) without abnormal findings: Secondary | ICD-10-CM

## 2016-03-05 DIAGNOSIS — Z124 Encounter for screening for malignant neoplasm of cervix: Secondary | ICD-10-CM | POA: Diagnosis not present

## 2016-03-05 DIAGNOSIS — Z0000 Encounter for general adult medical examination without abnormal findings: Secondary | ICD-10-CM | POA: Diagnosis not present

## 2016-03-05 DIAGNOSIS — E039 Hypothyroidism, unspecified: Secondary | ICD-10-CM

## 2016-03-05 HISTORY — DX: Moderate cervical dysplasia: N87.1

## 2016-03-05 LAB — POCT URINALYSIS DIPSTICK
BILIRUBIN UA: NEGATIVE
Blood, UA: NEGATIVE
GLUCOSE UA: NEGATIVE
Ketones, UA: NEGATIVE
Leukocytes, UA: NEGATIVE
NITRITE UA: NEGATIVE
Protein, UA: NEGATIVE
UROBILINOGEN UA: NEGATIVE
pH, UA: 6.5

## 2016-03-05 NOTE — Progress Notes (Signed)
Reviewed personally.  M. Suzanne Celita Aron, MD.  

## 2016-03-05 NOTE — Patient Instructions (Addendum)

## 2016-03-09 LAB — HEMOGLOBIN, FINGERSTICK: HEMOGLOBIN, FINGERSTICK: 15.1 g/dL (ref 12.0–16.0)

## 2016-03-10 LAB — IPS PAP TEST WITH HPV

## 2016-04-19 ENCOUNTER — Other Ambulatory Visit: Payer: Self-pay | Admitting: Nurse Practitioner

## 2016-04-19 DIAGNOSIS — Z1231 Encounter for screening mammogram for malignant neoplasm of breast: Secondary | ICD-10-CM

## 2016-04-29 ENCOUNTER — Ambulatory Visit
Admission: RE | Admit: 2016-04-29 | Discharge: 2016-04-29 | Disposition: A | Discharge status: Home / Self Care | Payer: BLUE CROSS/BLUE SHIELD | Source: Ambulatory Visit | Attending: Nurse Practitioner | Admitting: Nurse Practitioner

## 2016-04-29 DIAGNOSIS — Z1231 Encounter for screening mammogram for malignant neoplasm of breast: Secondary | ICD-10-CM | POA: Diagnosis not present

## 2016-11-05 DIAGNOSIS — J069 Acute upper respiratory infection, unspecified: Secondary | ICD-10-CM | POA: Diagnosis not present

## 2016-11-05 DIAGNOSIS — J040 Acute laryngitis: Secondary | ICD-10-CM | POA: Diagnosis not present

## 2017-03-18 ENCOUNTER — Ambulatory Visit: Payer: BLUE CROSS/BLUE SHIELD | Admitting: Nurse Practitioner

## 2017-03-22 ENCOUNTER — Encounter: Payer: Self-pay | Admitting: Certified Nurse Midwife

## 2017-03-22 ENCOUNTER — Other Ambulatory Visit (HOSPITAL_COMMUNITY)
Admission: RE | Admit: 2017-03-22 | Discharge: 2017-03-22 | Disposition: A | Discharge status: Home / Self Care | Payer: BLUE CROSS/BLUE SHIELD | Source: Ambulatory Visit | Attending: Certified Nurse Midwife | Admitting: Certified Nurse Midwife

## 2017-03-22 ENCOUNTER — Ambulatory Visit (INDEPENDENT_AMBULATORY_CARE_PROVIDER_SITE_OTHER): Payer: BLUE CROSS/BLUE SHIELD | Admitting: Certified Nurse Midwife

## 2017-03-22 VITALS — BP 120/78 | HR 68 | Resp 16 | Ht 64.75 in | Wt 188.0 lb

## 2017-03-22 DIAGNOSIS — E039 Hypothyroidism, unspecified: Secondary | ICD-10-CM

## 2017-03-22 DIAGNOSIS — Z01419 Encounter for gynecological examination (general) (routine) without abnormal findings: Secondary | ICD-10-CM | POA: Diagnosis not present

## 2017-03-22 DIAGNOSIS — Z124 Encounter for screening for malignant neoplasm of cervix: Secondary | ICD-10-CM

## 2017-03-22 DIAGNOSIS — Z87891 Personal history of nicotine dependence: Secondary | ICD-10-CM | POA: Diagnosis not present

## 2017-03-22 DIAGNOSIS — Z801 Family history of malignant neoplasm of trachea, bronchus and lung: Secondary | ICD-10-CM | POA: Diagnosis not present

## 2017-03-22 DIAGNOSIS — Z0000 Encounter for general adult medical examination without abnormal findings: Secondary | ICD-10-CM | POA: Diagnosis not present

## 2017-03-22 NOTE — Patient Instructions (Signed)

## 2017-03-22 NOTE — Progress Notes (Addendum)
51 y.o. G0P0000 Married  Caucasian Fe here for annual exam. Sees on site MD for prn visits. Would like to have labs here today. Thyroid medication working well ? She has been receiving her prescription from the on site MD and TSH has not been checked .Menopausal no HRT. Denies vaginal bleeding or vaginal dryness. Has gained 17 pounds over the past year, she feels related to her brothers death due to life long alcoholism and being so close to him and caring for him. Has not sought grief counseling, but "probably should". Brother was cared for by Hospice also. Patient tried to deal with stress of all this on her own, but will consider grief counseling. Patient ahs gained weight,and aware she is not taking care of herself.   No LMP recorded. Patient has had an ablation.          Sexually active: Yes.    The current method of family planning is tubal ligation.    Exercising: No.  exercise Smoker:  no  Health Maintenance: Pap:  03-03-15 LGSIL, 03-05-16 neg HPV HR neg History of Abnormal Pap: yes LEEP MMG:  04-29-16 category a density birads 1:neg Self Breast exams: no Colonoscopy:  1/16 diverticulosis & hyperplastic polyp f/u 5yrs BMD:   none TDaP:  2012 Shingles: no Pneumonia: no Hep C and HIV: not done Labs: yes   reports that she quit smoking about 8 years ago. Her smoking use included Cigarettes. She has a 10.00 pack-year smoking history. She has never used smokeless tobacco. She reports that she drinks about 1.2 - 1.8 oz of alcohol per week . She reports that she does not use drugs.  Past Medical History:  Diagnosis Date  . Abnormal Pap smear of cervix    -age 21/unsure if she had any treatment/03-03-15 LSIL:Pos HR HPV;LEEP 04-14-15 LGSIL w/neg margins  . Dysmenorrhea   . Hyperlipidemia   . Hypothyroid 10/05/10  . Menorrhagia     Past Surgical History:  Procedure Laterality Date  . COLONOSCOPY W/ BIOPSIES  07/10/2014   Diverticulosis of large intestines and hyperplastic ployp X 1 recheck  in 10 yrs.  Marland Kitchen LEEP  04/14/15   CIN I  . NOVASURE ABLATION  11/2010  . TUBAL LIGATION  2006    Current Outpatient Prescriptions  Medication Sig Dispense Refill  . aspirin 81 MG tablet Take 81 mg by mouth daily.    . cholecalciferol (VITAMIN D) 1000 UNITS tablet Take 1,000 Units by mouth daily.    Marland Kitchen levothyroxine (SYNTHROID, LEVOTHROID) 100 MCG tablet Take 100 mcg by mouth daily before breakfast.    . Multiple Vitamin (MULTIVITAMIN) tablet Take 1 tablet by mouth daily.    Marland Kitchen pyridOXINE (VITAMIN B-6) 100 MG tablet Take 100 mg by mouth daily.     No current facility-administered medications for this visit.     Family History  Problem Relation Age of Onset  . Heart attack Father   . Hypertension Father   . Heart failure Father   . Hyperlipidemia Father   . Lung cancer Sister 78       lung cancer  . Infertility Sister   . Thyroid disease Sister   . Cancer Sister        lung; died at 51yr  . COPD Mother   . Hyperlipidemia Mother   . Heart attack Brother 52  . COPD Brother   . Thyroid disease Sister   . Cancer Maternal Grandmother        lymph nodes  .  Arthritis Maternal Grandfather     ROS:  Pertinent items are noted in HPI.  Otherwise, a comprehensive ROS was negative.  Exam:   There were no vitals taken for this visit.   Ht Readings from Last 3 Encounters:  03/05/16 5' 4.75" (1.645 m)  05/26/15 5' 4.75" (1.645 m)  05/12/15 5' 4.75" (1.645 m)    General appearance: alert, cooperative and appears stated age Head: Normocephalic, without obvious abnormality, atraumatic Neck: no adenopathy, supple, symmetrical, trachea midline and thyroid normal to inspection and palpation Lungs: clear to auscultation bilaterally Breasts: normal appearance, no masses or tenderness, No nipple retraction or dimpling, No nipple discharge or bleeding, No axillary or supraclavicular adenopathy Heart: regular rate and rhythm Abdomen: soft, non-tender; no masses,  no organomegaly Extremities:  extremities normal, atraumatic, no cyanosis or edema Skin: Skin color, texture, turgor normal. No rashes or lesions Lymph nodes: Cervical, supraclavicular, and axillary nodes normal. No abnormal inguinal nodes palpated Neurologic: Grossly normal   Pelvic: External genitalia:  no lesions              Urethra:  normal appearing urethra with no masses, tenderness or lesions              Bartholin's and Skene's: normal                 Vagina: normal appearing vagina with normal color and discharge, no lesions              Cervix: no cervical motion tenderness, no lesions and normal appearance              Pap taken: Yes.   Bimanual Exam:  Uterus:  normal size, contour, position, consistency, mobility, non-tender              Adnexa: normal adnexa and no mass, fullness, tenderness               Rectovaginal: Confirms               Anus:  normal sphincter tone, no lesions  Chaperone present: yes  A:  Well Woman with normal exam  Menopausal no HRT  Hypothyroidism needs lab check today  History of CIN 2 with LEEP  , History of CIN1 in 2016  Family history of lung cancer would like evaluation screening, previous smoker long term  Screening labs  P:   Reviewed health and wellness pertinent to exam  Aware of need to advise if vaginal bleeding  Will do lab today. Important to have managed by provider doing labs, to avoid mistakes with medication. Patient agreeable. Has current Rx.  Discussed will advise once inquires what is the best route for evaluation.   Labs:Hep.C, HIV, Lipid panel ,TSH, Vitamin d  Pap smear: yes   counseled on breast self exam, mammography screening, menopause, adequate intake of calcium and vitamin D, diet and exercise  return annually or prn  An After Visit Summary was printed and given to the patient.

## 2017-03-23 LAB — CYTOLOGY - PAP
ADEQUACY: ABSENT
DIAGNOSIS: NEGATIVE
HPV: NOT DETECTED

## 2017-03-23 LAB — COMPREHENSIVE METABOLIC PANEL
A/G RATIO: 1.6 (ref 1.2–2.2)
ALBUMIN: 4.2 g/dL (ref 3.5–5.5)
ALK PHOS: 88 IU/L (ref 39–117)
ALT: 70 IU/L — ABNORMAL HIGH (ref 0–32)
AST: 54 IU/L — AB (ref 0–40)
BILIRUBIN TOTAL: 0.5 mg/dL (ref 0.0–1.2)
BUN / CREAT RATIO: 21 (ref 9–23)
BUN: 16 mg/dL (ref 6–24)
CHLORIDE: 101 mmol/L (ref 96–106)
CO2: 23 mmol/L (ref 20–29)
Calcium: 10.1 mg/dL (ref 8.7–10.2)
Creatinine, Ser: 0.76 mg/dL (ref 0.57–1.00)
GFR calc Af Amer: 104 mL/min/{1.73_m2} (ref >59)
GFR calc non Af Amer: 90 mL/min/{1.73_m2} (ref >59)
GLOBULIN, TOTAL: 2.7 g/dL (ref 1.5–4.5)
GLUCOSE: 88 mg/dL (ref 65–99)
POTASSIUM: 5 mmol/L (ref 3.5–5.2)
SODIUM: 141 mmol/L (ref 134–144)
TOTAL PROTEIN: 6.9 g/dL (ref 6.0–8.5)

## 2017-03-23 LAB — TSH: TSH: 0.101 u[IU]/mL — ABNORMAL LOW (ref 0.450–4.500)

## 2017-03-23 LAB — HEPATITIS C ANTIBODY

## 2017-03-23 LAB — LIPID PANEL
CHOL/HDL RATIO: 4.7 ratio — AB (ref 0.0–4.4)
Cholesterol, Total: 275 mg/dL — ABNORMAL HIGH (ref 100–199)
HDL: 59 mg/dL (ref >39)
LDL Calculated: 167 mg/dL — ABNORMAL HIGH (ref 0–99)
Triglycerides: 246 mg/dL — ABNORMAL HIGH (ref 0–149)
VLDL Cholesterol Cal: 49 mg/dL — ABNORMAL HIGH (ref 5–40)

## 2017-03-23 LAB — VITAMIN D 25 HYDROXY (VIT D DEFICIENCY, FRACTURES): VIT D 25 HYDROXY: 20.5 ng/mL — AB (ref 30.0–100.0)

## 2017-03-23 LAB — HIV ANTIBODY (ROUTINE TESTING W REFLEX): HIV Screen 4th Generation wRfx: NONREACTIVE

## 2017-03-24 ENCOUNTER — Other Ambulatory Visit: Payer: Self-pay | Admitting: Certified Nurse Midwife

## 2017-03-24 DIAGNOSIS — R6889 Other general symptoms and signs: Secondary | ICD-10-CM

## 2017-03-25 ENCOUNTER — Telehealth: Payer: Self-pay

## 2017-03-25 NOTE — Telephone Encounter (Signed)
-----   Message from Verner Chol, CNM sent at 03/24/2017  3:54 AM EDT ----- Notify patient that her TSH level is too low. She is on Synthroid per her work MD of 100 mcg and needs to reduce to 75 mcg now and recheck in 4 weeks. Will need scheduled.  order placed. Very important she follow up with this, can affect how she is feeling. Vitamin D is also low will need to start on OTC 2000 IU Vit D 3 and recheck in 3 months Lipid profile is very elevated and needs PCP management, Please refer to Kriste Basque  Kidney and glucose profile is normal, but Liver AST, ALT is elevated needs to stop all OTC medications Advil, Tylenol, alcohol use and recheck with TSH order placed Hep C and HIV are negative Pap smear is negative, HPVHR not detected, no endos detected 02

## 2017-03-25 NOTE — Telephone Encounter (Signed)
Spoke with patient. Advised of results as seen below from [employer] CNM. Patient verbalizes understanding. Patient will speak with prescribing MD of synthroid for medication to be adjusted. Lab results released to patient for printing and showing to work MD for management. 4 week recheck scheduled for 04/25/2017 at 8:30 am. Aware she will also have AST and ALT rechecked at this appointment. Will discontinue use of OTC medications and alcohol use. Will follow up with physician at work regarding cholesterol management. Labs released to patient for printing to review with MD. 3 month lab recheck for vitamin D scheduled for 06/24/2017 at 8:40 am. 02 recall placed.  Routing to provider for final review. Patient agreeable to disposition. Will close encounter.

## 2017-04-25 ENCOUNTER — Other Ambulatory Visit (INDEPENDENT_AMBULATORY_CARE_PROVIDER_SITE_OTHER): Payer: BLUE CROSS/BLUE SHIELD

## 2017-04-25 DIAGNOSIS — R6889 Other general symptoms and signs: Secondary | ICD-10-CM

## 2017-04-26 ENCOUNTER — Telehealth: Payer: Self-pay

## 2017-04-26 ENCOUNTER — Other Ambulatory Visit: Payer: Self-pay | Admitting: Certified Nurse Midwife

## 2017-04-26 DIAGNOSIS — R7989 Other specified abnormal findings of blood chemistry: Secondary | ICD-10-CM

## 2017-04-26 DIAGNOSIS — R899 Unspecified abnormal finding in specimens from other organs, systems and tissues: Secondary | ICD-10-CM

## 2017-04-26 LAB — AST: AST: 26 IU/L (ref 0–40)

## 2017-04-26 LAB — ALT: ALT: 36 IU/L — AB (ref 0–32)

## 2017-04-26 LAB — TSH: TSH: 14.05 u[IU]/mL — ABNORMAL HIGH (ref 0.450–4.500)

## 2017-04-26 NOTE — Telephone Encounter (Signed)
-----   Message from Verner Choleborah S Leonard, CNM sent at 04/26/2017 12:41 PM EDT ----- Notified patient of TSH results and elevation. Patient has not seen MD at work and was taking Synthroid every other day. Denies any untoward symptoms. She also had stopped all OTC medications and given report of AST normal now and ALT significant decrease. Will continue no OTC medications for at least another week and will be careful of use. Will recheck at Vitamin D check in 12/18. Discussed concerns with thyroid management and feel she needs to have Endocrine evaluation and management to avoid problems with unstable levels. Patient to start medication daily again. Patient agreeable to [place]. She will be called with appt. Information. Questions addressed.

## 2017-04-26 NOTE — Telephone Encounter (Signed)
Referral placed to [facility] for patient to see first available provider for elevated TSH level. [facility] is closed at this time. Will call first thing in AM to assist with scheduling.

## 2017-04-27 NOTE — Telephone Encounter (Signed)
[facility] contacted the patient. Patient is scheduled to see Dr.Sean Everardo AllEllison on 05/19/2017 at 9 am.  Routing to [employer] Leonard CNM.

## 2017-05-19 ENCOUNTER — Ambulatory Visit (INDEPENDENT_AMBULATORY_CARE_PROVIDER_SITE_OTHER): Payer: BLUE CROSS/BLUE SHIELD | Admitting: Endocrinology

## 2017-05-19 ENCOUNTER — Encounter: Payer: Self-pay | Admitting: Endocrinology

## 2017-05-19 DIAGNOSIS — E041 Nontoxic single thyroid nodule: Secondary | ICD-10-CM | POA: Insufficient documentation

## 2017-05-19 HISTORY — DX: Nontoxic single thyroid nodule: E04.1

## 2017-05-19 LAB — T4, FREE: FREE T4: 0.97 ng/dL (ref 0.60–1.60)

## 2017-05-19 LAB — TSH: TSH: 1.61 u[IU]/mL (ref 0.35–4.50)

## 2017-05-19 NOTE — Patient Instructions (Addendum)
blood tests are requested for you today.  We'll let you know about the results. Let's check the ultrasound.  you will receive a phone call, about a day and time for an appointment. If there is a nodule, we'll check the biopsy here.  It is easy and painless.  Please come back for a follow-up appointment in 6 months.        Levothyroxine tablets What is this medicine? LEVOTHYROXINE (lee voe thye ROX een) is a thyroid hormone. This medicine can improve symptoms of thyroid deficiency such as slow speech, lack of energy, weight gain, hair loss, dry skin, and feeling cold. It also helps to treat goiter (an enlarged thyroid gland). It is also used to treat some kinds of thyroid cancer along with surgery and other medicines. This medicine may be used for other purposes; ask your health care provider or pharmacist if you have questions. COMMON BRAND NAME(S): Estre, Levo-T, Levothroid, Levoxyl, Synthroid, Thyro-Tabs, Unithroid What should I tell my health care provider before I take this medicine? They need to know if you have any of these conditions: -angina -blood clotting problems -diabetes -dieting or on a weight loss program -fertility problems -heart disease -high levels of thyroid hormone -pituitary gland problem -previous heart attack -an unusual or allergic reaction to levothyroxine, thyroid hormones, other medicines, foods, dyes, or preservatives -pregnant or trying to get pregnant -breast-feeding How should I use this medicine? Take this medicine by mouth with plenty of water. It is best to take on an empty stomach, at least 30 minutes before or 2 hours after food. Follow the directions on the prescription label. Take at the same time each day. Do not take your medicine more often than directed. Contact your pediatrician regarding the use of this medicine in children. While this drug may be prescribed for children and infants as young as a few days of age for selected conditions,  precautions do apply. For infants, you may crush the tablet and place in a small amount of (5-10 ml or 1 to 2 teaspoonfuls) of water, breast milk, or non-soy based infant formula. Do not mix with soy-based infant formula. Give as directed. Overdosage: If you think you have taken too much of this medicine contact a poison control center or emergency room at once. NOTE: This medicine is only for you. Do not share this medicine with others. What if I miss a dose? If you miss a dose, take it as soon as you can. If it is almost time for your next dose, take only that dose. Do not take double or extra doses. What may interact with this medicine? -amiodarone -antacids -anti-thyroid medicines -calcium supplements -carbamazepine -cholestyramine -colestipol -digoxin -female hormones, including contraceptive or birth control pills -iron supplements -ketamine -liquid nutrition products like Ensure -medicines for colds and breathing difficulties -medicines for diabetes -medicines for mental depression -medicines or herbals used to decrease weight or appetite -phenobarbital or other barbiturate medications -phenytoin -prednisone or other corticosteroids -rifabutin -rifampin -soy isoflavones -sucralfate -theophylline -warfarin This list may not describe all possible interactions. Give your health care provider a list of all the medicines, herbs, non-prescription drugs, or dietary supplements you use. Also tell them if you smoke, drink alcohol, or use illegal drugs. Some items may interact with your medicine. What should I watch for while using this medicine? Be sure to take this medicine with plenty of fluids. Some tablets may cause choking, gagging, or difficulty swallowing from the tablet getting stuck in your throat. Most of these  problems disappear if the medicine is taken with the right amount of water or other fluids. Do not switch brands of this medicine unless your health care professional  agrees with the change. Ask questions if you are uncertain. You will need regular exams and occasional blood tests to check the response to treatment. If you are receiving this medicine for an underactive thyroid, it may be several weeks before you notice an improvement. Check with your doctor or health care professional if your symptoms do not improve. It may be necessary for you to take this medicine for the rest of your life. Do not stop using this medicine unless your doctor or health care professional advises you to. This medicine can affect blood sugar levels. If you have diabetes, check your blood sugar as directed. You may lose some of your hair when you first start treatment. With time, this usually corrects itself. If you are going to have surgery, tell your doctor or health care professional that you are taking this medicine. What side effects may I notice from receiving this medicine? Side effects that you should report to your doctor or health care professional as soon as possible: -allergic reactions like skin rash, itching or hives, swelling of the face, lips, or tongue -chest pain -excessive sweating or intolerance to heat -fast or irregular heartbeat -nervousness -skin rash or hives -swelling of ankles, feet, or legs -tremors Side effects that usually do not require medical attention (report to your doctor or health care professional if they continue or are bothersome): -changes in appetite -changes in menstrual periods -diarrhea -hair loss -headache -trouble sleeping -weight loss This list may not describe all possible side effects. Call your doctor for medical advice about side effects. You may report side effects to FDA at 1-800-FDA-1088. Where should I keep my medicine? Keep out of the reach of children. Store at room temperature between 15 and 30 degrees C (59 and 86 degrees F). Protect from light and moisture. Keep container tightly closed. Throw away any unused  medicine after the expiration date. NOTE: This sheet is a summary. It may not cover all possible information. If you have questions about this medicine, talk to your doctor, pharmacist, or health care provider.  2018 Elsevier/Gold Standard (2008-09-27 14:28:07)

## 2017-05-19 NOTE — Progress Notes (Signed)
Subjective:    Patient ID: Melissa Gibson, female    DOB: [birth year], 51 y.o.   MRN: [id number]  HPI Pt is referred by Leota Sauers, CNM, for hypothyroidism.  Pt reports hypothyroidism was dx'ed in 2014.  she has been on prescribed thyroid hormone therapy since then.  she has never taken kelp or any other type of non-prescribed thyroid product.  she has never had thyroid imaging.  She has never had thyroid surgery, or XRT to the neck.  she has never been on amiodarone or lithium.  TSH has been variable over the past few months, despite no change in synthroid dosage in the past 2 years.  She takes synthroid on an empty stomach.  She has moderate fatigue, and assoc difficulty with concentration.   Past Medical History:  Diagnosis Date  . Abnormal Pap smear of cervix    -age 72/unsure if she had any treatment/03-03-15 LSIL:Pos HR HPV;LEEP 04-14-15 LGSIL w/neg margins  . Dysmenorrhea   . Hyperlipidemia   . Hypothyroid 10/05/10  . Menorrhagia     Past Surgical History:  Procedure Laterality Date  . COLONOSCOPY W/ BIOPSIES  07/10/2014   Diverticulosis of large intestines and hyperplastic ployp X 1 recheck in 10 yrs.  Marland Kitchen LEEP  04/14/15   CIN I  . NOVASURE ABLATION  11/2010  . TUBAL LIGATION  2006    Social History   Socioeconomic History  . Marital status: Married    Spouse name: Not on file  . Number of children: Not on file  . Years of education: Not on file  . Highest education level: Not on file  Social Needs  . Financial resource strain: Not on file  . Food insecurity - worry: Not on file  . Food insecurity - inability: Not on file  . Transportation needs - medical: Not on file  . Transportation needs - non-medical: Not on file  Occupational History  . Occupation: Airline pilot  Tobacco Use  . Smoking status: Former Smoker    Packs/day: 0.50    Years: 20.00    Pack years: 10.00    Types: Cigarettes    Last attempt to quit: 07/05/2008    Years since quitting: 8.8  . Smokeless  tobacco: Never Used  Substance and Sexual Activity  . Alcohol use: Yes    Alcohol/week: 0.6 - 1.2 oz    Types: 1 - 2 Standard drinks or equivalent per week  . Drug use: No  . Sexual activity: Yes    Partners: Male    Birth control/protection: Surgical    Comment: BTL, Ablation  Other Topics Concern  . Not on file  Social History Narrative   Regular Exercise -  NO   Former smoker- quit 5 years ago.   internet sales   Married   No children   1 year of college   Enjoys crafts, Presenter, broadcasting.    Current Outpatient Medications on File Prior to Visit  Medication Sig Dispense Refill  . levothyroxine (SYNTHROID, LEVOTHROID) 100 MCG tablet Take 100 mcg by mouth daily before breakfast.     No current facility-administered medications on file prior to visit.     No Known Allergies  Family History  Problem Relation Age of Onset  . Heart attack Father   . Hypertension Father   . Heart failure Father   . Hyperlipidemia Father   . Lung cancer Sister 39       lung cancer  . Infertility Sister   . Thyroid disease  Sister   . Cancer Sister        lung; died at 360yr  . COPD Mother   . Hyperlipidemia Mother   . Heart attack Brother 52  . COPD Brother   . Cirrhosis Brother   . Thyroid disease Sister   . Cancer Maternal Grandmother        lymph nodes  . Arthritis Maternal Grandfather     BP (!) 142/82 (BP Location: Left Arm, Patient Position: Sitting, Cuff Size: Normal)   Pulse 81   Wt 194 lb 3.2 oz (88.1 kg)   SpO2 94%   BMI 32.57 kg/m     Review of Systems denies hair loss, muscle cramps, sob, numbness, blurry vision, cold intolerance, rhinorrhea, easy bruising, and syncope.  She has anxiety, myalgias, foot cramping, weight gain, constipation, and dry skin.      Objective:   Physical Exam VS: see vs page GEN: no distress HEAD: head: no deformity eyes: no periorbital swelling, no proptosis.  external nose and ears are normal mouth: no lesion seen NECK: 2 cm RUP thyroid  nodule is noted.   CHEST WALL: no deformity LUNGS: clear to auscultation CV: reg rate and rhythm, no murmur ABD: abdomen is soft, nontender.  no hepatosplenomegaly.  not distended.  no hernia MUSCULOSKELETAL: muscle bulk and strength are grossly normal.  no obvious joint swelling.  gait is normal and steady EXTEMITIES: no deformity.  no edema PULSES: no carotid bruit NEURO:  cn 2-12 grossly intact.   readily moves all 4's.  sensation is intact to touch on all 4's SKIN:  Normal texture and temperature.  No rash or suspicious lesion is visible.   NODES:  None palpable at the neck PSYCH: alert, well-oriented.  Does not appear anxious nor depressed.   I have reviewed outside records, and summarized: Pt was noted to have elevated TSH, and referred here.  probs addressed were wellness, situational depression, and weight gain  Labs: TSH has varied from 0.1-5.1 since 2014     Assessment & Plan:  Chronic primary hypothyroidism, new to me.  Reason for variable TSH is unclear.  We'll follow for now, and focus on synthroid taking advice, as below  Patient Instructions  blood tests are requested for you today.  We'll let you know about the results. Let's check the ultrasound.  you will receive a phone call, about a day and time for an appointment. If there is a nodule, we'll check the biopsy here.  It is easy and painless.  Please come back for a follow-up appointment in 6 months.        Levothyroxine tablets What is this medicine? LEVOTHYROXINE (lee voe thye ROX een) is a thyroid hormone. This medicine can improve symptoms of thyroid deficiency such as slow speech, lack of energy, weight gain, hair loss, dry skin, and feeling cold. It also helps to treat goiter (an enlarged thyroid gland). It is also used to treat some kinds of thyroid cancer along with surgery and other medicines. This medicine may be used for other purposes; ask your health care provider or pharmacist if you have  questions. COMMON BRAND NAME(S): Estre, Levo-T, Levothroid, Levoxyl, Synthroid, Thyro-Tabs, Unithroid What should I tell my health care provider before I take this medicine? They need to know if you have any of these conditions: -angina -blood clotting problems -diabetes -dieting or on a weight loss program -fertility problems -heart disease -high levels of thyroid hormone -pituitary gland problem -previous heart attack -an unusual or allergic  reaction to levothyroxine, thyroid hormones, other medicines, foods, dyes, or preservatives -pregnant or trying to get pregnant -breast-feeding How should I use this medicine? Take this medicine by mouth with plenty of water. It is best to take on an empty stomach, at least 30 minutes before or 2 hours after food. Follow the directions on the prescription label. Take at the same time each day. Do not take your medicine more often than directed. Contact your pediatrician regarding the use of this medicine in children. While this drug may be prescribed for children and infants as young as a few days of age for selected conditions, precautions do apply. For infants, you may crush the tablet and place in a small amount of (5-10 ml or 1 to 2 teaspoonfuls) of water, breast milk, or non-soy based infant formula. Do not mix with soy-based infant formula. Give as directed. Overdosage: If you think you have taken too much of this medicine contact a poison control center or emergency room at once. NOTE: This medicine is only for you. Do not share this medicine with others. What if I miss a dose? If you miss a dose, take it as soon as you can. If it is almost time for your next dose, take only that dose. Do not take double or extra doses. What may interact with this medicine? -amiodarone -antacids -anti-thyroid medicines -calcium supplements -carbamazepine -cholestyramine -colestipol -digoxin -female hormones, including contraceptive or birth control  pills -iron supplements -ketamine -liquid nutrition products like Ensure -medicines for colds and breathing difficulties -medicines for diabetes -medicines for mental depression -medicines or herbals used to decrease weight or appetite -phenobarbital or other barbiturate medications -phenytoin -prednisone or other corticosteroids -rifabutin -rifampin -soy isoflavones -sucralfate -theophylline -warfarin This list may not describe all possible interactions. Give your health care provider a list of all the medicines, herbs, non-prescription drugs, or dietary supplements you use. Also tell them if you smoke, drink alcohol, or use illegal drugs. Some items may interact with your medicine. What should I watch for while using this medicine? Be sure to take this medicine with plenty of fluids. Some tablets may cause choking, gagging, or difficulty swallowing from the tablet getting stuck in your throat. Most of these problems disappear if the medicine is taken with the right amount of water or other fluids. Do not switch brands of this medicine unless your health care professional agrees with the change. Ask questions if you are uncertain. You will need regular exams and occasional blood tests to check the response to treatment. If you are receiving this medicine for an underactive thyroid, it may be several weeks before you notice an improvement. Check with your doctor or health care professional if your symptoms do not improve. It may be necessary for you to take this medicine for the rest of your life. Do not stop using this medicine unless your doctor or health care professional advises you to. This medicine can affect blood sugar levels. If you have diabetes, check your blood sugar as directed. You may lose some of your hair when you first start treatment. With time, this usually corrects itself. If you are going to have surgery, tell your doctor or health care professional that you are taking  this medicine. What side effects may I notice from receiving this medicine? Side effects that you should report to your doctor or health care professional as soon as possible: -allergic reactions like skin rash, itching or hives, swelling of the face, lips, or tongue -chest pain -  excessive sweating or intolerance to heat -fast or irregular heartbeat -nervousness -skin rash or hives -swelling of ankles, feet, or legs -tremors Side effects that usually do not require medical attention (report to your doctor or health care professional if they continue or are bothersome): -changes in appetite -changes in menstrual periods -diarrhea -hair loss -headache -trouble sleeping -weight loss This list may not describe all possible side effects. Call your doctor for medical advice about side effects. You may report side effects to FDA at 1-800-FDA-1088. Where should I keep my medicine? Keep out of the reach of children. Store at room temperature between 15 and 30 degrees C (59 and 86 degrees F). Protect from light and moisture. Keep container tightly closed. Throw away any unused medicine after the expiration date. NOTE: This sheet is a summary. It may not cover all possible information. If you have questions about this medicine, talk to your doctor, pharmacist, or health care provider.  2018 Elsevier/Gold Standard (2008-09-27 14:28:07)

## 2017-05-23 ENCOUNTER — Ambulatory Visit
Admission: RE | Admit: 2017-05-23 | Discharge: 2017-05-23 | Disposition: A | Discharge status: Home / Self Care | Payer: BLUE CROSS/BLUE SHIELD | Source: Ambulatory Visit | Attending: Endocrinology | Admitting: Endocrinology

## 2017-05-23 DIAGNOSIS — E041 Nontoxic single thyroid nodule: Secondary | ICD-10-CM

## 2017-05-23 DIAGNOSIS — E042 Nontoxic multinodular goiter: Secondary | ICD-10-CM | POA: Diagnosis not present

## 2017-05-25 ENCOUNTER — Encounter: Payer: Self-pay | Admitting: Endocrinology

## 2017-06-24 ENCOUNTER — Telehealth: Payer: Self-pay

## 2017-06-24 ENCOUNTER — Other Ambulatory Visit: Payer: BLUE CROSS/BLUE SHIELD

## 2017-06-24 DIAGNOSIS — R7989 Other specified abnormal findings of blood chemistry: Secondary | ICD-10-CM | POA: Diagnosis not present

## 2017-06-24 DIAGNOSIS — R899 Unspecified abnormal finding in specimens from other organs, systems and tissues: Secondary | ICD-10-CM

## 2017-06-24 NOTE — Telephone Encounter (Signed)
Vitamin D level placed for lab recheck due to low vitamin D level.

## 2017-06-25 LAB — VITAMIN D 25 HYDROXY (VIT D DEFICIENCY, FRACTURES): VIT D 25 HYDROXY: 35.6 ng/mL (ref 30.0–100.0)

## 2017-06-25 LAB — ALT: ALT: 34 IU/L — AB (ref 0–32)

## 2017-06-29 ENCOUNTER — Telehealth: Payer: Self-pay | Admitting: *Deleted

## 2017-06-29 NOTE — Telephone Encounter (Signed)
Will close encounter

## 2017-06-29 NOTE — Telephone Encounter (Signed)
Routing to PepsiCoDeborah Leonard, CNM FYI.   Notes recorded by Leda MinHamm, Loretta Kluender N, RN on 06/29/2017 at 12:12 PM EST Spoke with patient, advised as seen below per Leota Sauerseborah Leonard, CNM. Patient states she has not been seen by PCP, advised to f/u regarding elevated lipid profile and elevated ALT as previously recommended. Patient verbalizes understanding and is agreeable. See telephone encounter dated 06/29/17 to review with provider. ------  Notes recorded by Verner CholLeonard, Deborah S, CNM on 06/29/2017 at 7:22 AM EST Notify patient that her Vitamin D is much better at 35.6 . can now maintain at Vitamin D 3 1000 IU daily now and recheck at annual exam. ALT is decreasing almost to normal level at 34. Has she been seen by PCP regarding her elevations and lipid profile. I saw note on thyroid and Dr. Everardo AllEllison following now, she needs to

## 2017-06-29 NOTE — Telephone Encounter (Signed)
Agree with plan 

## 2017-07-03 DIAGNOSIS — J029 Acute pharyngitis, unspecified: Secondary | ICD-10-CM | POA: Diagnosis not present

## 2017-07-03 DIAGNOSIS — R030 Elevated blood-pressure reading, without diagnosis of hypertension: Secondary | ICD-10-CM | POA: Diagnosis not present

## 2017-07-03 DIAGNOSIS — J209 Acute bronchitis, unspecified: Secondary | ICD-10-CM | POA: Diagnosis not present

## 2017-07-04 ENCOUNTER — Other Ambulatory Visit: Payer: Self-pay | Admitting: Endocrinology

## 2017-07-04 DIAGNOSIS — E039 Hypothyroidism, unspecified: Secondary | ICD-10-CM

## 2017-11-17 ENCOUNTER — Ambulatory Visit: Payer: BLUE CROSS/BLUE SHIELD | Admitting: Endocrinology

## 2017-11-17 ENCOUNTER — Encounter: Payer: Self-pay | Admitting: Endocrinology

## 2017-11-17 VITALS — BP 126/80 | HR 77 | Ht 64.75 in | Wt 192.0 lb

## 2017-11-17 DIAGNOSIS — E039 Hypothyroidism, unspecified: Secondary | ICD-10-CM | POA: Diagnosis not present

## 2017-11-17 LAB — T4, FREE: Free T4: 0.94 ng/dL (ref 0.60–1.60)

## 2017-11-17 LAB — TSH: TSH: 1.99 u[IU]/mL (ref 0.35–4.50)

## 2017-11-17 NOTE — Progress Notes (Signed)
Subjective:    Patient ID: Melissa Gibson, female    DOB: [birth year], 52 y.o.   MRN: [id number]  HPI Pt returns for f/u of hypothyroidism (dx'ed 2014; TSH has been variable, despite no change in synthroid; US showed right thyroid nodule, 1.8 cm).  She has difficulty with concentration.  She does not notice the nodule.  Past Medical History:  Diagnosis Date  . Abnormal Pap smear of cervix    -age 31/unsure if she had any treatment/03-03-15 LSIL:Pos HR HPV;LEEP 04-14-15 LGSIL w/neg margins  . Dysmenorrhea   . Hyperlipidemia   . Hypothyroid 10/05/10  . Menorrhagia     Past Surgical History:  Procedure Laterality Date  . COLONOSCOPY W/ BIOPSIES  07/10/2014   Diverticulosis of large intestines and hyperplastic ployp X 1 recheck in 10 yrs.  Marland Kitchen LEEP  04/14/15   CIN I  . NOVASURE ABLATION  11/2010  . TUBAL LIGATION  2006    Social History   Socioeconomic History  . Marital status: Married    Spouse name: Not on file  . Number of children: Not on file  . Years of education: Not on file  . Highest education level: Not on file  Occupational History  . Occupation: Airline pilot  Social Needs  . Financial resource strain: Not on file  . Food insecurity:    Worry: Not on file    Inability: Not on file  . Transportation needs:    Medical: Not on file    Non-medical: Not on file  Tobacco Use  . Smoking status: Former Smoker    Packs/day: 0.50    Years: 20.00    Pack years: 10.00    Types: Cigarettes    Last attempt to quit: 07/05/2008    Years since quitting: 9.3  . Smokeless tobacco: Never Used  Substance and Sexual Activity  . Alcohol use: Yes    Alcohol/week: 0.6 - 1.2 oz    Types: 1 - 2 Standard drinks or equivalent per week  . Drug use: No  . Sexual activity: Yes    Partners: Male    Birth control/protection: Surgical    Comment: BTL, Ablation  Lifestyle  . Physical activity:    Days per week: Not on file    Minutes per session: Not on file  . Stress: Not on file    Relationships  . Social connections:    Talks on phone: Not on file    Gets together: Not on file    Attends religious service: Not on file    Active member of club or organization: Not on file    Attends meetings of clubs or organizations: Not on file    Relationship status: Not on file  . Intimate partner violence:    Fear of current or ex partner: Not on file    Emotionally abused: Not on file    Physically abused: Not on file    Forced sexual activity: Not on file  Other Topics Concern  . Not on file  Social History Narrative   Regular Exercise -  NO   Former smoker- quit 5 years ago.   internet sales   Married   No children   1 year of college   Enjoys crafts, Presenter, broadcasting.    Current Outpatient Medications on File Prior to Visit  Medication Sig Dispense Refill  . levothyroxine (SYNTHROID, LEVOTHROID) 100 MCG tablet Take 100 mcg by mouth daily before breakfast.     No current facility-administered medications on file  prior to visit.     No Known Allergies  Family History  Problem Relation Age of Onset  . Heart attack Father   . Hypertension Father   . Heart failure Father   . Hyperlipidemia Father   . Lung cancer Sister 72       lung cancer  . Infertility Sister   . Thyroid disease Sister   . Cancer Sister        lung; died at 80yr  . COPD Mother   . Hyperlipidemia Mother   . Heart attack Brother 52  . COPD Brother   . Cirrhosis Brother   . Thyroid disease Sister   . Cancer Maternal Grandmother        lymph nodes  . Arthritis Maternal Grandfather     BP 126/80   Pulse 77   Ht 5' 4.75" (1.645 m)   Wt 192 lb (87.1 kg)   SpO2 94%   BMI 32.20 kg/m   Review of Systems Denies neck pain    Objective:   Physical Exam VITAL SIGNS:  See vs page GENERAL: no distress NECK: 1-2 cm right thyroid nodule   Lab Results  Component Value Date   TSH 1.99 11/17/2017      Assessment & Plan:  Hypothyroidism: well-replaced Thyroid nodule: clinically  stable  Patient Instructions  blood tests are requested for you today.  We'll let you know about the results.   Please come back for a follow-up appointment in 6 months.

## 2017-11-17 NOTE — Patient Instructions (Addendum)
blood tests are requested for you today.  We'll let you know about the results.   Please come back for a follow-up appointment in 6 months.  

## 2017-11-22 ENCOUNTER — Other Ambulatory Visit: Payer: Self-pay | Admitting: Nurse Practitioner

## 2017-11-22 DIAGNOSIS — Z1231 Encounter for screening mammogram for malignant neoplasm of breast: Secondary | ICD-10-CM

## 2017-11-25 ENCOUNTER — Telehealth: Payer: Self-pay | Admitting: Endocrinology

## 2017-11-25 ENCOUNTER — Other Ambulatory Visit: Payer: Self-pay

## 2017-11-25 MED ORDER — LEVOTHYROXINE SODIUM 100 MCG PO TABS: 100.0000 ug | ORAL_TABLET | Freq: Every day | ORAL | 0 refills | Status: DC

## 2017-11-25 NOTE — Telephone Encounter (Signed)
levothyroxine (SYNTHROID, LEVOTHROID) 100 MCG tablet   Patient needs Dr Everardo All to send in prescription for her medication.    [id number] - [place], [place] - [address]

## 2017-11-25 NOTE — Telephone Encounter (Signed)
I have sent to patient;'s pharmacy.  

## 2017-12-01 ENCOUNTER — Telehealth: Payer: Self-pay | Admitting: Endocrinology

## 2017-12-01 MED ORDER — LEVOTHYROXINE SODIUM 100 MCG PO TABS: 100.0000 ug | ORAL_TABLET | Freq: Every day | ORAL | 1 refills | Status: DC

## 2017-12-01 NOTE — Telephone Encounter (Signed)
levothyroxine (SYNTHROID, LEVOTHROID) 100 MCG tablet   Patient stated she picked up her prescription and stated that there was no refills on this,.  She states this is a new prescription and assumed there would be more then one refill for her to continue taking this   Please advise

## 2017-12-01 NOTE — Telephone Encounter (Signed)
I called pt- she requests new rx for 90 day supply be sent. New rx sent. See meds.

## 2017-12-15 ENCOUNTER — Other Ambulatory Visit: Payer: Self-pay | Admitting: Nurse Practitioner

## 2017-12-15 ENCOUNTER — Ambulatory Visit
Admission: RE | Admit: 2017-12-15 | Discharge: 2017-12-15 | Disposition: A | Discharge status: Home / Self Care | Payer: BLUE CROSS/BLUE SHIELD | Source: Ambulatory Visit | Attending: Nurse Practitioner | Admitting: Nurse Practitioner

## 2017-12-15 DIAGNOSIS — Z1231 Encounter for screening mammogram for malignant neoplasm of breast: Secondary | ICD-10-CM

## 2018-03-31 ENCOUNTER — Other Ambulatory Visit (HOSPITAL_COMMUNITY)
Admission: RE | Admit: 2018-03-31 | Discharge: 2018-03-31 | Disposition: A | Discharge status: Home / Self Care | Payer: BLUE CROSS/BLUE SHIELD | Source: Ambulatory Visit | Attending: Obstetrics & Gynecology | Admitting: Obstetrics & Gynecology

## 2018-03-31 ENCOUNTER — Other Ambulatory Visit: Payer: Self-pay

## 2018-03-31 ENCOUNTER — Encounter: Payer: Self-pay | Admitting: Certified Nurse Midwife

## 2018-03-31 ENCOUNTER — Ambulatory Visit (INDEPENDENT_AMBULATORY_CARE_PROVIDER_SITE_OTHER): Payer: BLUE CROSS/BLUE SHIELD | Admitting: Certified Nurse Midwife

## 2018-03-31 VITALS — BP 118/78 | HR 70 | Resp 16 | Ht 64.75 in | Wt 192.0 lb

## 2018-03-31 DIAGNOSIS — Z0000 Encounter for general adult medical examination without abnormal findings: Secondary | ICD-10-CM

## 2018-03-31 DIAGNOSIS — Z124 Encounter for screening for malignant neoplasm of cervix: Secondary | ICD-10-CM | POA: Diagnosis not present

## 2018-03-31 DIAGNOSIS — E559 Vitamin D deficiency, unspecified: Secondary | ICD-10-CM | POA: Diagnosis not present

## 2018-03-31 DIAGNOSIS — R6889 Other general symptoms and signs: Secondary | ICD-10-CM

## 2018-03-31 DIAGNOSIS — Z01419 Encounter for gynecological examination (general) (routine) without abnormal findings: Secondary | ICD-10-CM

## 2018-03-31 NOTE — Progress Notes (Signed)
52 y.o. G0P0000 Married  Caucasian Fe here for annual exam. Menopausal with occasional hot flashes, no issues. Denies vaginal bleeding, occasional vaginal dryness, no issues. Sees Dr. Everardo All for hypothyroid and nodule management twice yearly, medication stable at this point. Sees PCP as needed. Has noted borderline elevation of BP at times, monitoring with in store BP. Noted occasional palpitations, no SOB or chest pain. Has not seen PCP regarding. No other health issues today. Took trip to [place] this summer!  No LMP recorded. Patient has had an ablation.          Sexually active: Yes.    The current method of family planning is tubal ligation.    Exercising: No.  exercise Smoker:  no  Review of Systems  Constitutional: Negative.        Weight gain  HENT: Negative.   Eyes: Negative.   Respiratory: Negative.   Cardiovascular: Positive for palpitations.  Gastrointestinal: Positive for constipation.  Genitourinary: Negative.   Musculoskeletal:       Muscle/joint pain  Skin: Negative.   Neurological: Negative.   Endo/Heme/Allergies:       Craving sweets, heat/cold intolerance  Psychiatric/Behavioral: Negative.     Health Maintenance: Pap:  03-05-16 neg HPV HR neg, 03-22-17 neg HPV HR neg History of Abnormal Pap: LEEP MMG:  12-15-17 category a density birads 1:neg Self Breast exams: no Colonoscopy:  1/16 diverticulosis & hyperplastic polyp f/u 56yrs BMD:   none TDaP:  2012 Shingles: no Pneumonia: no Hep C and HIV: both neg 2018 Labs: yes   reports that she quit smoking about 9 years ago. Her smoking use included cigarettes. She has a 10.00 pack-year smoking history. She has never used smokeless tobacco. She reports that she drinks about 1.0 - 2.0 standard drinks of alcohol per week. She reports that she does not use drugs.  Past Medical History:  Diagnosis Date  . Abnormal Pap smear of cervix    -age 46/unsure if she had any treatment/03-03-15 LSIL:Pos HR HPV;LEEP 04-14-15 LGSIL  w/neg margins  . Dysmenorrhea   . Hyperlipidemia   . Hypothyroid 10/05/10  . Menorrhagia     Past Surgical History:  Procedure Laterality Date  . COLONOSCOPY W/ BIOPSIES  07/10/2014   Diverticulosis of large intestines and hyperplastic ployp X 1 recheck in 10 yrs.  Marland Kitchen LEEP  04/14/15   CIN I  . NOVASURE ABLATION  11/2010  . TUBAL LIGATION  2006    Current Outpatient Medications  Medication Sig Dispense Refill  . levothyroxine (SYNTHROID, LEVOTHROID) 100 MCG tablet Take 1 tablet (100 mcg total) by mouth daily before breakfast. 90 tablet 1   No current facility-administered medications for this visit.     Family History  Problem Relation Age of Onset  . Heart attack Father   . Hypertension Father   . Heart failure Father   . Hyperlipidemia Father   . Lung cancer Sister 66       lung cancer  . Infertility Sister   . Thyroid disease Sister   . Cancer Sister        lung; died at 1yr  . COPD Mother   . Hyperlipidemia Mother   . Heart attack Brother 52  . COPD Brother   . Cirrhosis Brother   . Thyroid disease Sister   . Cancer Maternal Grandmother        lymph nodes  . Arthritis Maternal Grandfather     ROS:  Pertinent items are noted in HPI.  Otherwise, a comprehensive  ROS was negative.  Exam:   BP 118/78   Pulse 70   Resp 16   Ht 5' 4.75" (1.645 m)   Wt 192 lb (87.1 kg)   BMI 32.20 kg/m  Height: 5' 4.75" (164.5 cm) Ht Readings from Last 3 Encounters:  03/31/18 5' 4.75" (1.645 m)  11/17/17 5' 4.75" (1.645 m)  03/22/17 5' 4.75" (1.645 m)    General appearance: alert, cooperative and appears stated age Head: Normocephalic, without obvious abnormality, atraumatic Neck: no adenopathy, supple, symmetrical, trachea midline and thyroid enlarged and nodular Lungs: clear to auscultation bilaterally Breasts: normal appearance, no masses or tenderness, No nipple retraction or dimpling, No nipple discharge or bleeding, No axillary or supraclavicular adenopathy Heart:  regular rate and rhythm Abdomen: soft, non-tender; no masses,  no organomegaly Extremities: extremities normal, atraumatic, no cyanosis or edema Skin: Skin color, texture, turgor normal. No rashes or lesions Lymph nodes: Cervical, supraclavicular, and axillary nodes normal. No abnormal inguinal nodes palpated Neurologic: Grossly normal   Pelvic: External genitalia:  no lesions              Urethra:  normal appearing urethra with no masses, tenderness or lesions              Bartholin's and Skene's: normal                 Vagina: normal appearing vagina with normal color and discharge, no lesions              Cervix: no cervical motion tenderness, no lesions, retroverted and LEEP appearance with stenotic os              Pap taken: Yes.   Bimanual Exam:  Uterus:  normal size, contour, position, consistency, mobility, non-tender and anteverted              Adnexa: normal adnexa and no mass, fullness, tenderness               Rectovaginal: Confirms               Anus:  normal sphincter tone, no lesions  Chaperone present: yes  A:  Well Woman with normal exam  Menopausal symptoms, history of ablation  Enlarged thyroid with nodules with Endocrine follow up  Occasional BP elevations, self monitoring  Palpitations noted, no evaluation at present  Screening labs  P:   Reviewed health and wellness pertinent to exam  Will advise if vaginal bleeding which would be abnormal with ablation and menopause  Continue follow up with endocrine as indicated  Discussed patient needs to follow up with PCP regarding palpitations and BP changes. Work on diet with reduce salt intake to help with BP. Warning signs with BP and palpitations given. Reduce caffeine intake if uses. Patient voiced understanding and will follow up.  Lab: CBC, CMP, Lipid panel, Vitamin D  Pap smear: yes   counseled on breast self exam, mammography screening, feminine hygiene, menopause, adequate intake of calcium and vitamin D,  diet and exercise  return annually or prn  An After Visit Summary was printed and given to the patient.

## 2018-03-31 NOTE — Patient Instructions (Signed)

## 2018-04-01 LAB — COMPREHENSIVE METABOLIC PANEL
ALBUMIN: 4.3 g/dL (ref 3.5–5.5)
ALK PHOS: 90 IU/L (ref 39–117)
ALT: 43 IU/L — ABNORMAL HIGH (ref 0–32)
AST: 30 IU/L (ref 0–40)
Albumin/Globulin Ratio: 1.7 (ref 1.2–2.2)
BUN/Creatinine Ratio: 22 (ref 9–23)
BUN: 19 mg/dL (ref 6–24)
Bilirubin Total: 0.4 mg/dL (ref 0.0–1.2)
CO2: 25 mmol/L (ref 20–29)
CREATININE: 0.87 mg/dL (ref 0.57–1.00)
Calcium: 10.3 mg/dL — ABNORMAL HIGH (ref 8.7–10.2)
Chloride: 102 mmol/L (ref 96–106)
GFR calc non Af Amer: 76 mL/min/{1.73_m2} (ref >59)
GFR, EST AFRICAN AMERICAN: 88 mL/min/{1.73_m2} (ref >59)
GLOBULIN, TOTAL: 2.5 g/dL (ref 1.5–4.5)
GLUCOSE: 89 mg/dL (ref 65–99)
POTASSIUM: 4.6 mmol/L (ref 3.5–5.2)
Sodium: 142 mmol/L (ref 134–144)
TOTAL PROTEIN: 6.8 g/dL (ref 6.0–8.5)

## 2018-04-01 LAB — LIPID PANEL
CHOLESTEROL TOTAL: 241 mg/dL — AB (ref 100–199)
Chol/HDL Ratio: 4.6 ratio — ABNORMAL HIGH (ref 0.0–4.4)
HDL: 52 mg/dL (ref >39)
LDL CALC: 164 mg/dL — AB (ref 0–99)
TRIGLYCERIDES: 124 mg/dL (ref 0–149)
VLDL Cholesterol Cal: 25 mg/dL (ref 5–40)

## 2018-04-01 LAB — CBC
HEMOGLOBIN: 15.4 g/dL (ref 11.1–15.9)
Hematocrit: 46.1 % (ref 34.0–46.6)
MCH: 29.7 pg (ref 26.6–33.0)
MCHC: 33.4 g/dL (ref 31.5–35.7)
MCV: 89 fL (ref 79–97)
Platelets: 335 10*3/uL (ref 150–450)
RBC: 5.19 x10E6/uL (ref 3.77–5.28)
RDW: 12.4 % (ref 12.3–15.4)
WBC: 8.8 10*3/uL (ref 3.4–10.8)

## 2018-04-01 LAB — VITAMIN D 25 HYDROXY (VIT D DEFICIENCY, FRACTURES): Vit D, 25-Hydroxy: 28 ng/mL — ABNORMAL LOW (ref 30.0–100.0)

## 2018-04-02 ENCOUNTER — Other Ambulatory Visit: Payer: Self-pay | Admitting: Certified Nurse Midwife

## 2018-04-02 DIAGNOSIS — R899 Unspecified abnormal finding in specimens from other organs, systems and tissues: Secondary | ICD-10-CM

## 2018-04-03 LAB — CYTOLOGY - PAP
Adequacy: ABSENT
DIAGNOSIS: NEGATIVE

## 2018-04-07 ENCOUNTER — Telehealth: Payer: Self-pay | Admitting: *Deleted

## 2018-04-07 NOTE — Telephone Encounter (Signed)
Received Medical records from [facility]; forwarded to provider/SLS 10/04

## 2018-04-20 ENCOUNTER — Other Ambulatory Visit (INDEPENDENT_AMBULATORY_CARE_PROVIDER_SITE_OTHER): Payer: BLUE CROSS/BLUE SHIELD

## 2018-04-20 DIAGNOSIS — R899 Unspecified abnormal finding in specimens from other organs, systems and tissues: Secondary | ICD-10-CM | POA: Diagnosis not present

## 2018-04-21 LAB — PARATHYROID HORMONE, INTACT (NO CA): PTH: 38 pg/mL (ref 15–65)

## 2018-04-21 LAB — ALT: ALT: 40 IU/L — ABNORMAL HIGH (ref 0–32)

## 2018-05-19 ENCOUNTER — Ambulatory Visit: Payer: BLUE CROSS/BLUE SHIELD | Admitting: Endocrinology

## 2018-05-19 VITALS — BP 144/70 | HR 90 | Ht 64.75 in | Wt 194.0 lb

## 2018-05-19 DIAGNOSIS — E041 Nontoxic single thyroid nodule: Secondary | ICD-10-CM | POA: Diagnosis not present

## 2018-05-19 DIAGNOSIS — E038 Other specified hypothyroidism: Secondary | ICD-10-CM | POA: Diagnosis not present

## 2018-05-19 LAB — T4, FREE: Free T4: 0.96 ng/dL (ref 0.60–1.60)

## 2018-05-19 LAB — TSH: TSH: 1.43 u[IU]/mL (ref 0.35–4.50)

## 2018-05-19 NOTE — Progress Notes (Signed)
Subjective:    Patient ID: Melissa Gibson, female    DOB: Jun 18, 1966, 52 y.o.   MRN: [id number]  HPI Pt returns for f/u of hypothyroidism (dx'ed 2014; TSH has been variable, despite no change in synthroid; US showed right thyroid nodule, 1.8 cm; f/u US in 2018 was unchanged, and meets criteria for a 1 year follow-up ).  She has difficulty with concentration.  She can notice the nodule.  She says the size is unchanged.   Past Medical History:  Diagnosis Date  . Abnormal Pap smear of cervix    -age 35/unsure if she had any treatment/03-03-15 LSIL:Pos HR HPV;LEEP 04-14-15 LGSIL w/neg margins  . Dysmenorrhea   . Hyperlipidemia   . Hypothyroid 10/05/10  . Menorrhagia     Past Surgical History:  Procedure Laterality Date  . COLONOSCOPY W/ BIOPSIES  07/10/2014   Diverticulosis of large intestines and hyperplastic ployp X 1 recheck in 10 yrs.  Marland Kitchen LEEP  04/14/15   CIN I  . NOVASURE ABLATION  11/2010  . TUBAL LIGATION  2006    Social History   Socioeconomic History  . Marital status: Married    Spouse name: Not on file  . Number of children: Not on file  . Years of education: Not on file  . Highest education level: Not on file  Occupational History  . Occupation: Airline pilot  Social Needs  . Financial resource strain: Not on file  . Food insecurity:    Worry: Not on file    Inability: Not on file  . Transportation needs:    Medical: Not on file    Non-medical: Not on file  Tobacco Use  . Smoking status: Former Smoker    Packs/day: 0.50    Years: 20.00    Pack years: 10.00    Types: Cigarettes    Last attempt to quit: 07/05/2008    Years since quitting: 9.8  . Smokeless tobacco: Never Used  Substance and Sexual Activity  . Alcohol use: Yes    Alcohol/week: 1.0 - 2.0 standard drinks    Types: 1 - 2 Standard drinks or equivalent per week  . Drug use: No  . Sexual activity: Yes    Partners: Male    Birth control/protection: Surgical    Comment: BTL, Ablation  Lifestyle  .  Physical activity:    Days per week: Not on file    Minutes per session: Not on file  . Stress: Not on file  Relationships  . Social connections:    Talks on phone: Not on file    Gets together: Not on file    Attends religious service: Not on file    Active member of club or organization: Not on file    Attends meetings of clubs or organizations: Not on file    Relationship status: Not on file  . Intimate partner violence:    Fear of current or ex partner: Not on file    Emotionally abused: Not on file    Physically abused: Not on file    Forced sexual activity: Not on file  Other Topics Concern  . Not on file  Social History Narrative   Regular Exercise -  NO   Former smoker- quit 5 years ago.   internet sales   Married   No children   1 year of college   Enjoys crafts, Presenter, broadcasting.    Current Outpatient Medications on File Prior to Visit  Medication Sig Dispense Refill  . levothyroxine (SYNTHROID,  LEVOTHROID) 100 MCG tablet Take 1 tablet (100 mcg total) by mouth daily before breakfast. 90 tablet 1   No current facility-administered medications on file prior to visit.     No Known Allergies  Family History  Problem Relation Age of Onset  . Heart attack Father   . Hypertension Father   . Heart failure Father   . Hyperlipidemia Father   . Lung cancer Sister 1753       lung cancer  . Infertility Sister   . Thyroid disease Sister   . Cancer Sister        lung; died at 2766yr  . COPD Mother   . Hyperlipidemia Mother   . Heart attack Brother 52  . COPD Brother   . Cirrhosis Brother   . Thyroid disease Sister   . Cancer Maternal Grandmother        lymph nodes  . Arthritis Maternal Grandfather     BP (!) 144/70 (BP Location: Right Arm, Patient Position: Sitting, Cuff Size: Normal)   Pulse 90   Ht 5' 4.75" (1.645 m)   Wt 194 lb (88 kg)   SpO2 97%   BMI 32.53 kg/m    Review of Systems Denies neck pain.      Objective:   Physical Exam VITAL SIGNS:  See vs  page GENERAL: no distress NECK: 1-2 cm right thyroid nodule is again noted.     Lab Results  Component Value Date   TSH 1.43 05/19/2018      Assessment & Plan:  Hypothyroidism: well-replaced.  Please continue the same medication. Thyroid nodule: due for recheck  Patient Instructions  blood tests are requested for you today.  We'll let you know about the results.  Let's recheck the ultrasound.  you will receive a phone call, about a day and time for an appointment.  Please come back for a follow-up appointment in 6 months.

## 2018-05-19 NOTE — Patient Instructions (Addendum)
blood tests are requested for you today.  We'll let you know about the results.  Let's recheck the ultrasound.  you will receive a phone call, about a day and time for an appointment.  Please come back for a follow-up appointment in 6 months.     

## 2018-05-26 ENCOUNTER — Other Ambulatory Visit: Payer: BLUE CROSS/BLUE SHIELD

## 2018-05-29 ENCOUNTER — Ambulatory Visit
Admission: RE | Admit: 2018-05-29 | Discharge: 2018-05-29 | Disposition: A | Discharge status: Home / Self Care | Payer: BLUE CROSS/BLUE SHIELD | Source: Ambulatory Visit | Attending: Endocrinology | Admitting: Endocrinology

## 2018-05-29 DIAGNOSIS — E041 Nontoxic single thyroid nodule: Secondary | ICD-10-CM | POA: Diagnosis not present

## 2018-07-04 ENCOUNTER — Other Ambulatory Visit: Payer: Self-pay | Admitting: Endocrinology

## 2018-07-11 ENCOUNTER — Other Ambulatory Visit: Payer: Self-pay | Admitting: Endocrinology

## 2018-09-22 DIAGNOSIS — L409 Psoriasis, unspecified: Secondary | ICD-10-CM | POA: Diagnosis not present

## 2018-10-05 ENCOUNTER — Other Ambulatory Visit: Payer: Self-pay | Admitting: Endocrinology

## 2018-10-06 MED ORDER — LEVOTHYROXINE SODIUM 100 MCG PO TABS: 100.0000 ug | ORAL_TABLET | Freq: Every day | ORAL | 0 refills | Status: DC

## 2018-11-16 ENCOUNTER — Encounter: Payer: Self-pay | Admitting: Endocrinology

## 2018-11-17 ENCOUNTER — Ambulatory Visit (INDEPENDENT_AMBULATORY_CARE_PROVIDER_SITE_OTHER): Payer: BLUE CROSS/BLUE SHIELD | Admitting: Endocrinology

## 2018-11-17 ENCOUNTER — Other Ambulatory Visit: Payer: Self-pay

## 2018-11-17 DIAGNOSIS — E038 Other specified hypothyroidism: Secondary | ICD-10-CM

## 2018-11-17 NOTE — Progress Notes (Signed)
Subjective:    Patient ID: Melissa Gibson, female    DOB: 04/28/1966, 53 y.o.   MRN: [id number]  HPI  telehealth visit today via phone x 7 minutes.   Alternatives to telehealth are presented to this patient, and the patient agrees to the telehealth visit. Pt is advised of the cost of the visit, and agrees to this, also.   Patient is in her car, and I am at the office.   Persons attending the telehealth visit: the patient and I Pt returns for f/u of hypothyroidism (dx'ed 2014; TSH has been variable, despite no change in synthroid; US showed right thyroid nodule, 1.8 cm; f/u US in 2019 was unchanged, and met criteria for a 1 year follow-up ).  She again reports difficulty with concentration.  She does not notice the nodule.    Past Medical History:  Diagnosis Date  . Abnormal Pap smear of cervix    -age 83/unsure if she had any treatment/03-03-15 LSIL:Pos HR HPV;LEEP 04-14-15 LGSIL w/neg margins  . Dysmenorrhea   . Hyperlipidemia   . Hypothyroid 10/05/10  . Menorrhagia     Past Surgical History:  Procedure Laterality Date  . COLONOSCOPY W/ BIOPSIES  07/10/2014   Diverticulosis of large intestines and hyperplastic ployp X 1 recheck in 10 yrs.  Marland Kitchen LEEP  04/14/15   CIN I  . NOVASURE ABLATION  11/2010  . TUBAL LIGATION  2006    Social History   Socioeconomic History  . Marital status: Married    Spouse name: Not on file  . Number of children: Not on file  . Years of education: Not on file  . Highest education level: Not on file  Occupational History  . Occupation: Press photographer  Social Needs  . Financial resource strain: Not on file  . Food insecurity:    Worry: Not on file    Inability: Not on file  . Transportation needs:    Medical: Not on file    Non-medical: Not on file  Tobacco Use  . Smoking status: Former Smoker    Packs/day: 0.50    Years: 20.00    Pack years: 10.00    Types: Cigarettes    Last attempt to quit: 07/05/2008    Years since quitting: 10.3  . Smokeless  tobacco: Never Used  Substance and Sexual Activity  . Alcohol use: Yes    Alcohol/week: 1.0 - 2.0 standard drinks    Types: 1 - 2 Standard drinks or equivalent per week  . Drug use: No  . Sexual activity: Yes    Partners: Male    Birth control/protection: Surgical    Comment: BTL, Ablation  Lifestyle  . Physical activity:    Days per week: Not on file    Minutes per session: Not on file  . Stress: Not on file  Relationships  . Social connections:    Talks on phone: Not on file    Gets together: Not on file    Attends religious service: Not on file    Active member of club or organization: Not on file    Attends meetings of clubs or organizations: Not on file    Relationship status: Not on file  . Intimate partner violence:    Fear of current or ex partner: Not on file    Emotionally abused: Not on file    Physically abused: Not on file    Forced sexual activity: Not on file  Other Topics Concern  . Not on file  Social History Narrative   Regular Exercise -  NO   Former smoker- quit 5 years ago.   internet sales   Married   No children   1 year of college   Enjoys crafts, Haematologist.    Current Outpatient Medications on File Prior to Visit  Medication Sig Dispense Refill  . levothyroxine (SYNTHROID, LEVOTHROID) 100 MCG tablet Take 1 tablet (100 mcg total) by mouth daily before breakfast. 90 tablet 0   No current facility-administered medications on file prior to visit.     No Known Allergies  Family History  Problem Relation Age of Onset  . Heart attack Father   . Hypertension Father   . Heart failure Father   . Hyperlipidemia Father   . Lung cancer Sister 69       lung cancer  . Infertility Sister   . Thyroid disease Sister   . Cancer Sister        lung; died at 48yr . COPD Mother   . Hyperlipidemia Mother   . Heart attack Brother 537 . COPD Brother   . Cirrhosis Brother   . Thyroid disease Sister   . Cancer Maternal Grandmother        lymph nodes   . Arthritis Maternal Grandfather       Review of Systems     Objective:   Physical Exam    Lab Results  Component Value Date   TSH 1.43 05/19/2018      Assessment & Plan:  Hypothyroidism: due for recheck. MNG: she needs annual f/u  Patient Instructions  blood tests are requested for you today.  We'll let you know about the results.   Please come back for a follow-up appointment in 6 months.

## 2018-11-17 NOTE — Patient Instructions (Addendum)
blood tests are requested for you today.  We'll let you know about the results.   Please come back for a follow-up appointment in 6 months.  

## 2019-01-16 ENCOUNTER — Other Ambulatory Visit: Payer: Self-pay | Admitting: Endocrinology

## 2019-01-16 NOTE — Telephone Encounter (Signed)
Your patient 

## 2019-02-19 ENCOUNTER — Other Ambulatory Visit: Payer: Self-pay | Admitting: Obstetrics and Gynecology

## 2019-02-19 DIAGNOSIS — Z1231 Encounter for screening mammogram for malignant neoplasm of breast: Secondary | ICD-10-CM

## 2019-04-02 ENCOUNTER — Other Ambulatory Visit (HOSPITAL_COMMUNITY)
Admission: RE | Admit: 2019-04-02 | Discharge: 2019-04-02 | Disposition: A | Discharge status: Home / Self Care | Payer: BC Managed Care – PPO | Source: Ambulatory Visit | Attending: Certified Nurse Midwife | Admitting: Certified Nurse Midwife

## 2019-04-02 ENCOUNTER — Encounter: Payer: Self-pay | Admitting: Certified Nurse Midwife

## 2019-04-02 ENCOUNTER — Other Ambulatory Visit: Payer: Self-pay

## 2019-04-02 ENCOUNTER — Ambulatory Visit: Payer: BC Managed Care – PPO | Admitting: Certified Nurse Midwife

## 2019-04-02 VITALS — BP 120/80 | HR 70 | Temp 97.1°F | Resp 16 | Ht 64.75 in | Wt 189.0 lb

## 2019-04-02 DIAGNOSIS — Z0000 Encounter for general adult medical examination without abnormal findings: Secondary | ICD-10-CM

## 2019-04-02 DIAGNOSIS — Z01419 Encounter for gynecological examination (general) (routine) without abnormal findings: Secondary | ICD-10-CM | POA: Diagnosis not present

## 2019-04-02 DIAGNOSIS — Z124 Encounter for screening for malignant neoplasm of cervix: Secondary | ICD-10-CM | POA: Insufficient documentation

## 2019-04-02 DIAGNOSIS — E559 Vitamin D deficiency, unspecified: Secondary | ICD-10-CM | POA: Diagnosis not present

## 2019-04-02 NOTE — Patient Instructions (Signed)

## 2019-04-02 NOTE — Progress Notes (Signed)
53 y.o. G0P0000 Married  Caucasian Fe here for annual exam. Menopausal no vaginal bleeding. Denies any vaginal dryness issues. Some Menopausal warmth, no hot flashes or night sweats. Sees Endocrine for hypothyroid management, all stable.  Working from home and doing well. Sees PCP prn. No other health issues today.  No LMP recorded. Patient has had an ablation.          Sexually active: Yes.    The current method of family planning is tubal ligation.    Exercising: No.  exercise Smoker:  no  Review of Systems  Constitutional: Negative.   HENT: Negative.   Eyes: Negative.   Respiratory: Negative.   Cardiovascular: Negative.   Gastrointestinal: Negative.   Genitourinary: Negative.   Musculoskeletal: Negative.   Skin: Negative.   Neurological: Negative.   Endo/Heme/Allergies: Negative.   Psychiatric/Behavioral: Negative.     Health Maintenance: Pap:  03-22-17 neg HPV HR neg, 03-31-18 neg History of Abnormal Pap: LEEP MMG:  12-15-17 category a density birads 1:neg, scheduled for 04/2019 Self Breast exams: yes Colonoscopy:  2016 BMD:   none TDaP:  2012 Shingles: had 1st one done Pneumonia: none Hep C and HIV: both neg 2018 Labs: Here today   reports that she quit smoking about 10 years ago. Her smoking use included cigarettes. She has a 10.00 pack-year smoking history. She has never used smokeless tobacco. She reports current alcohol use of about 1.0 - 2.0 standard drinks of alcohol per week. She reports that she does not use drugs.  Past Medical History:  Diagnosis Date  . Abnormal Pap smear of cervix    -age 1/unsure if she had any treatment/03-03-15 LSIL:Pos HR HPV;LEEP 04-14-15 LGSIL w/neg margins  . Dysmenorrhea   . Hyperlipidemia   . Hypothyroid 10/05/10  . Menorrhagia     Past Surgical History:  Procedure Laterality Date  . COLONOSCOPY W/ BIOPSIES  07/10/2014   Diverticulosis of large intestines and hyperplastic ployp X 1 recheck in 10 yrs.  Marland Kitchen LEEP  04/14/15   CIN I   . NOVASURE ABLATION  11/2010  . TUBAL LIGATION  2006    Current Outpatient Medications  Medication Sig Dispense Refill  . levothyroxine (SYNTHROID) 100 MCG tablet TAKE 1 TABLET BY MOUTH DAILY BEFORE BREAKFAST  90 tablet 0   No current facility-administered medications for this visit.     Family History  Problem Relation Age of Onset  . Heart attack Father   . Hypertension Father   . Heart failure Father   . Hyperlipidemia Father   . Lung cancer Sister 2       lung cancer  . Infertility Sister   . Thyroid disease Sister   . Cancer Sister        lung; died at 72yr  . COPD Mother   . Hyperlipidemia Mother   . Heart attack Brother 74  . COPD Brother   . Cirrhosis Brother   . Thyroid disease Sister   . Cancer Maternal Grandmother        lymph nodes  . Arthritis Maternal Grandfather     ROS:  Pertinent items are noted in HPI.  Otherwise, a comprehensive ROS was negative.  Exam:   There were no vitals taken for this visit.   Ht Readings from Last 3 Encounters:  05/19/18 5' 4.75" (1.645 m)  03/31/18 5' 4.75" (1.645 m)  11/17/17 5' 4.75" (1.645 m)    General appearance: alert, cooperative and appears stated age Head: Normocephalic, without obvious abnormality, atraumatic Neck:  no adenopathy, supple, symmetrical, trachea midline and thyroid mild enlargement with nodule noted on left(known) Lungs: clear to auscultation bilaterally Breasts: normal appearance, no masses or tenderness, No nipple retraction or dimpling, No nipple discharge or bleeding, No axillary or supraclavicular adenopathy Heart: regular rate and rhythm Abdomen: soft, non-tender; no masses,  no organomegaly Extremities: extremities normal, atraumatic, no cyanosis or edema Skin: Skin color, texture, turgor normal. No rashes or lesions Lymph nodes: Cervical, supraclavicular, and axillary nodes normal. No abnormal inguinal nodes palpated Neurologic: Grossly normal   Pelvic: External genitalia:  no  lesions              Urethra:  normal appearing urethra with no masses, tenderness or lesions              Bartholin's and Skene's: normal                 Vagina: normal appearing vagina with normal color and discharge, no lesions              Cervix: no bleeding following Pap, no cervical motion tenderness, no lesions and Leep appearance              Pap taken: Yes.   Bimanual Exam:  Uterus:  normal size, contour, position, consistency, mobility, non-tender and anteverted              Adnexa: normal adnexa and no mass, fullness, tenderness               Rectovaginal: Confirms               Anus:  normal sphincter tone, no lesions  Chaperone present: yes  A:  Well Woman with normal exam  Menopausal history of ablation  History of LEEP for abnormal pap smear  Mammogram scheduled  Hypothyroid with nodule with Endocrine management  Screening labs today  P:   Reviewed health and wellness pertinent to exam  Aware if vaginal bleeding she needs to advise  Stressed SBE and mammogram,   Continue follow up with Endocrine as indicated.  Labs: CBC,Lipid panel ,CMP, Vitamin D  Pap smear: yes   counseled on breast self exam, mammography screening, feminine hygiene, adequate intake of calcium and vitamin D, diet and exercise  return annually or prn  An After Visit Summary was printed and given to the patient.

## 2019-04-03 LAB — CBC
Hematocrit: 43.1 % (ref 34.0–46.6)
Hemoglobin: 14.9 g/dL (ref 11.1–15.9)
MCH: 30 pg (ref 26.6–33.0)
MCHC: 34.6 g/dL (ref 31.5–35.7)
MCV: 87 fL (ref 79–97)
Platelets: 307 10*3/uL (ref 150–450)
RBC: 4.97 x10E6/uL (ref 3.77–5.28)
RDW: 12.1 % (ref 11.7–15.4)
WBC: 9 10*3/uL (ref 3.4–10.8)

## 2019-04-03 LAB — LIPID PANEL
Chol/HDL Ratio: 4.9 ratio — ABNORMAL HIGH (ref 0.0–4.4)
Cholesterol, Total: 227 mg/dL — ABNORMAL HIGH (ref 100–199)
HDL: 46 mg/dL (ref >39)
LDL Chol Calc (NIH): 138 mg/dL — ABNORMAL HIGH (ref 0–99)
Triglycerides: 237 mg/dL — ABNORMAL HIGH (ref 0–149)
VLDL Cholesterol Cal: 43 mg/dL — ABNORMAL HIGH (ref 5–40)

## 2019-04-03 LAB — COMPREHENSIVE METABOLIC PANEL
ALT: 32 IU/L (ref 0–32)
AST: 24 IU/L (ref 0–40)
Albumin/Globulin Ratio: 1.7 (ref 1.2–2.2)
Albumin: 4.1 g/dL (ref 3.8–4.9)
Alkaline Phosphatase: 100 IU/L (ref 39–117)
BUN/Creatinine Ratio: 32 — ABNORMAL HIGH (ref 9–23)
BUN: 23 mg/dL (ref 6–24)
Bilirubin Total: 0.3 mg/dL (ref 0.0–1.2)
CO2: 22 mmol/L (ref 20–29)
Calcium: 9.6 mg/dL (ref 8.7–10.2)
Chloride: 103 mmol/L (ref 96–106)
Creatinine, Ser: 0.71 mg/dL (ref 0.57–1.00)
GFR calc Af Amer: 112 mL/min/{1.73_m2} (ref >59)
GFR calc non Af Amer: 97 mL/min/{1.73_m2} (ref >59)
Globulin, Total: 2.4 g/dL (ref 1.5–4.5)
Glucose: 93 mg/dL (ref 65–99)
Potassium: 4.9 mmol/L (ref 3.5–5.2)
Sodium: 139 mmol/L (ref 134–144)
Total Protein: 6.5 g/dL (ref 6.0–8.5)

## 2019-04-03 LAB — VITAMIN D 25 HYDROXY (VIT D DEFICIENCY, FRACTURES): Vit D, 25-Hydroxy: 25.4 ng/mL — ABNORMAL LOW (ref 30.0–100.0)

## 2019-04-04 ENCOUNTER — Other Ambulatory Visit: Payer: Self-pay | Admitting: Certified Nurse Midwife

## 2019-04-04 DIAGNOSIS — E559 Vitamin D deficiency, unspecified: Secondary | ICD-10-CM

## 2019-04-05 ENCOUNTER — Ambulatory Visit: Payer: BLUE CROSS/BLUE SHIELD | Admitting: Certified Nurse Midwife

## 2019-04-05 LAB — CYTOLOGY - PAP
Adequacy: ABSENT
Diagnosis: NEGATIVE

## 2019-04-06 ENCOUNTER — Ambulatory Visit
Admission: RE | Admit: 2019-04-06 | Discharge: 2019-04-06 | Disposition: A | Discharge status: Home / Self Care | Payer: BC Managed Care – PPO | Source: Ambulatory Visit | Attending: Obstetrics and Gynecology | Admitting: Obstetrics and Gynecology

## 2019-04-06 ENCOUNTER — Other Ambulatory Visit: Payer: Self-pay

## 2019-04-06 DIAGNOSIS — Z1231 Encounter for screening mammogram for malignant neoplasm of breast: Secondary | ICD-10-CM | POA: Diagnosis not present

## 2019-04-07 ENCOUNTER — Other Ambulatory Visit: Payer: Self-pay | Admitting: Endocrinology

## 2019-04-09 MED ORDER — LEVOTHYROXINE SODIUM 100 MCG PO TABS: 100.0000 ug | ORAL_TABLET | Freq: Every day | ORAL | 0 refills | Status: DC

## 2019-04-11 DIAGNOSIS — Z03818 Encounter for observation for suspected exposure to other biological agents ruled out: Secondary | ICD-10-CM | POA: Diagnosis not present

## 2019-05-21 ENCOUNTER — Other Ambulatory Visit: Payer: Self-pay

## 2019-05-21 ENCOUNTER — Ambulatory Visit: Payer: BC Managed Care – PPO | Admitting: Endocrinology

## 2019-05-21 ENCOUNTER — Encounter: Payer: Self-pay | Admitting: Endocrinology

## 2019-05-21 VITALS — BP 126/72 | HR 100 | Ht 64.75 in | Wt 196.0 lb

## 2019-05-21 DIAGNOSIS — E038 Other specified hypothyroidism: Secondary | ICD-10-CM | POA: Diagnosis not present

## 2019-05-21 DIAGNOSIS — E041 Nontoxic single thyroid nodule: Secondary | ICD-10-CM | POA: Diagnosis not present

## 2019-05-21 LAB — TSH: TSH: 2.19 u[IU]/mL (ref 0.35–4.50)

## 2019-05-21 LAB — T4, FREE: Free T4: 0.98 ng/dL (ref 0.60–1.60)

## 2019-05-21 NOTE — Progress Notes (Signed)
 Subjective:    Patient ID: Melissa Gibson, female    DOB: 06/24/1966, 53 y.o.   MRN: [id number]  HPI Pt returns for f/u of hypothyroidism (dx'ed 2014; TSH has been variable, despite no change in synthroid; US showed right thyroid nodule, 1.8 cm; f/u US in 2019 was unchanged, and met criteria for a 1 year follow-up ).  pt states she feels well in general. She intermittently notices the nodule.    Past Medical History:  Diagnosis Date  . Abnormal Pap smear of cervix    -age 17/unsure if she had any treatment/03-03-15 LSIL:Pos HR HPV;LEEP 04-14-15 LGSIL w/neg margins  . Dysmenorrhea   . Hyperlipidemia   . Hypothyroid 10/05/10  . Menorrhagia     Past Surgical History:  Procedure Laterality Date  . COLONOSCOPY W/ BIOPSIES  07/10/2014   Diverticulosis of large intestines and hyperplastic ployp X 1 recheck in 10 yrs.  . LEEP  04/14/15   CIN I  . NOVASURE ABLATION  11/2010  . TUBAL LIGATION  2006    Social History   Socioeconomic History  . Marital status: Married    Spouse name: Not on file  . Number of children: Not on file  . Years of education: Not on file  . Highest education level: Not on file  Occupational History  . Occupation: SALES  Social Needs  . Financial resource strain: Not on file  . Food insecurity    Worry: Not on file    Inability: Not on file  . Transportation needs    Medical: Not on file    Non-medical: Not on file  Tobacco Use  . Smoking status: Former Smoker    Packs/day: 0.50    Years: 20.00    Pack years: 10.00    Types: Cigarettes    Quit date: 07/05/2008    Years since quitting: 10.8  . Smokeless tobacco: Never Used  Substance and Sexual Activity  . Alcohol use: Yes    Alcohol/week: 1.0 - 2.0 standard drinks    Types: 1 - 2 Standard drinks or equivalent per week  . Drug use: No  . Sexual activity: Yes    Partners: Male    Birth control/protection: Surgical    Comment: BTL, Ablation  Lifestyle  . Physical activity    Days per week: Not  on file    Minutes per session: Not on file  . Stress: Not on file  Relationships  . Social connections    Talks on phone: Not on file    Gets together: Not on file    Attends religious service: Not on file    Active member of club or organization: Not on file    Attends meetings of clubs or organizations: Not on file    Relationship status: Not on file  . Intimate partner violence    Fear of current or ex partner: Not on file    Emotionally abused: Not on file    Physically abused: Not on file    Forced sexual activity: Not on file  Other Topics Concern  . Not on file  Social History Narrative   Regular Exercise -  NO   Former smoker- quit 5 years ago.   internet sales   Married   No children   1 year of college   Enjoys crafts, yardwork.    Current Outpatient Medications on File Prior to Visit  Medication Sig Dispense Refill  . levothyroxine (SYNTHROID) 100 MCG tablet TAKE ONE TABLET BY   MOUTH DAILY BEFORE BREAKFAST  90 tablet 0   No current facility-administered medications on file prior to visit.     No Known Allergies  Family History  Problem Relation Age of Onset  . Heart attack Father   . Hypertension Father   . Heart failure Father   . Hyperlipidemia Father   . Lung cancer Sister 53       lung cancer  . Infertility Sister   . Thyroid disease Sister   . Cancer Sister        lung; died at 53yr  . COPD Mother   . Hyperlipidemia Mother   . Heart attack Brother 52  . COPD Brother   . Cirrhosis Brother   . Thyroid disease Sister   . Cancer Maternal Grandmother        lymph nodes  . Arthritis Maternal Grandfather   . Breast cancer Neg Hx     BP 126/72 (BP Location: Right Arm, Patient Position: Sitting, Cuff Size: Normal)   Pulse 100   Ht 5' 4.75" (1.645 m)   Wt 196 lb (88.9 kg)   SpO2 98%   BMI 32.87 kg/m    Review of Systems Denies dysphagia and sob    Objective:   Physical Exam VITAL SIGNS:  See vs page GENERAL: no distress NECK: There is  no palpable thyroid enlargement.  No thyroid nodule is palpable.  No palpable lymphadenopathy at the anterior neck.   Lab Results  Component Value Date   TSH 2.19 05/21/2019      Assessment & Plan:  Thyroid nodule, due for recheck Hypothyroidism: well-replaced.  Please continue the same medication  Patient Instructions  Blood tests are requested for you today.  We'll let you know about the results.  Let's recheck the ultrasound.  you will receive a phone call, about a day and time for an appointment. Please come back for a follow-up appointment in 1 year.     

## 2019-05-21 NOTE — Patient Instructions (Signed)
Blood tests are requested for you today.  We'll let you know about the results.  Let's recheck the ultrasound.  you will receive a phone call, about a day and time for an appointment. Please come back for a follow-up appointment in 1 year.    

## 2019-06-14 ENCOUNTER — Ambulatory Visit
Admission: RE | Admit: 2019-06-14 | Discharge: 2019-06-14 | Disposition: A | Discharge status: Home / Self Care | Payer: BC Managed Care – PPO | Source: Ambulatory Visit | Attending: Endocrinology | Admitting: Endocrinology

## 2019-06-14 DIAGNOSIS — E041 Nontoxic single thyroid nodule: Secondary | ICD-10-CM | POA: Diagnosis not present

## 2019-06-22 ENCOUNTER — Other Ambulatory Visit: Payer: BC Managed Care – PPO

## 2019-07-10 ENCOUNTER — Other Ambulatory Visit: Payer: BC Managed Care – PPO

## 2019-07-24 ENCOUNTER — Other Ambulatory Visit: Payer: Self-pay

## 2019-07-27 ENCOUNTER — Ambulatory Visit: Payer: BC Managed Care – PPO | Admitting: Family

## 2019-07-27 ENCOUNTER — Encounter: Payer: Self-pay | Admitting: Family

## 2019-07-27 ENCOUNTER — Other Ambulatory Visit: Payer: Self-pay

## 2019-07-27 VITALS — BP 153/86 | HR 82 | Temp 97.6°F | Resp 16 | Ht 65.0 in | Wt 193.0 lb

## 2019-07-27 DIAGNOSIS — Z87891 Personal history of nicotine dependence: Secondary | ICD-10-CM | POA: Diagnosis not present

## 2019-07-27 DIAGNOSIS — R5383 Other fatigue: Secondary | ICD-10-CM

## 2019-07-27 DIAGNOSIS — R030 Elevated blood-pressure reading, without diagnosis of hypertension: Secondary | ICD-10-CM | POA: Diagnosis not present

## 2019-07-27 DIAGNOSIS — R202 Paresthesia of skin: Secondary | ICD-10-CM

## 2019-07-27 NOTE — Progress Notes (Signed)
Subjective:    Patient ID: Melissa Gibson, female    DOB: 08/04/1965, 54 y.o.   MRN: [id number]  HPI  Patient is a 54 year old female who presents today with several symptoms. Notes feeling light headed sometimes, occasional tingling of her feet.  Some cramping in her feet.  + fatigue, no snoring. Denies SOB or chest pain, denies swelling.   BP Readings from Last 3 Encounters:  07/27/19 (!) 153/86  05/21/19 126/72  04/02/19 120/80   History of tobacco abuse- requests low dose CT lung cancer screening.    Review of Systems   See HPI   Past Medical History:  Diagnosis Date  . Abnormal Pap smear of cervix    -age 69/unsure if she had any treatment/03-03-15 LSIL:Pos HR HPV;LEEP 04-14-15 LGSIL w/neg margins  . Dysmenorrhea   . Hyperlipidemia   . Hypothyroid 10/05/10  . Menorrhagia      Social History   Socioeconomic History  . Marital status: Married    Spouse name: Not on file  . Number of children: Not on file  . Years of education: Not on file  . Highest education level: Not on file  Occupational History  . Occupation: Airline pilot  Tobacco Use  . Smoking status: Former Smoker    Packs/day: 0.50    Years: 20.00    Pack years: 10.00    Types: Cigarettes    Quit date: 07/05/2008    Years since quitting: 11.0  . Smokeless tobacco: Never Used  Substance and Sexual Activity  . Alcohol use: Yes    Alcohol/week: 1.0 - 2.0 standard drinks    Types: 1 - 2 Standard drinks or equivalent per week  . Drug use: No  . Sexual activity: Yes    Partners: Male    Birth control/protection: Surgical    Comment: BTL, Ablation  Other Topics Concern  . Not on file  Social History Narrative   Regular Exercise -  NO   Former smoker- quit 5 years ago.   internet sales   Married   No children   1 year of college   Enjoys crafts, Presenter, broadcasting.   Social Determinants of Health   Financial Resource Strain:   . Difficulty of Paying Living Expenses: Not on file  Food Insecurity:   .  Worried About Programme researcher, broadcasting/film/video in the Last Year: Not on file  . Ran Out of Food in the Last Year: Not on file  Transportation Needs:   . Lack of Transportation (Medical): Not on file  . Lack of Transportation (Non-Medical): Not on file  Physical Activity:   . Days of Exercise per Week: Not on file  . Minutes of Exercise per Session: Not on file  Stress:   . Feeling of Stress : Not on file  Social Connections:   . Frequency of Communication with Friends and Family: Not on file  . Frequency of Social Gatherings with Friends and Family: Not on file  . Attends Religious Services: Not on file  . Active Member of Clubs or Organizations: Not on file  . Attends Banker Meetings: Not on file  . Marital Status: Not on file  Intimate Partner Violence:   . Fear of Current or Ex-Partner: Not on file  . Emotionally Abused: Not on file  . Physically Abused: Not on file  . Sexually Abused: Not on file    Past Surgical History:  Procedure Laterality Date  . COLONOSCOPY W/ BIOPSIES  07/10/2014   Diverticulosis of  large intestines and hyperplastic ployp X 1 recheck in 10 yrs.  Marland Kitchen LEEP  04/14/15   CIN I  . NOVASURE ABLATION  11/2010  . TUBAL LIGATION  2006    Family History  Problem Relation Age of Onset  . Heart attack Father   . Hypertension Father   . Heart failure Father   . Hyperlipidemia Father   . Lung cancer Sister 71       lung cancer  . Infertility Sister   . Thyroid disease Sister   . Cancer Sister        lung; died at 28yr  . COPD Mother   . Hyperlipidemia Mother   . Heart attack Brother 80  . COPD Brother   . Cirrhosis Brother        caused his death, ETOH abuse  . Thyroid disease Sister        hypothyroid  . Cancer Maternal Grandmother        lymph nodes  . Arthritis Maternal Grandfather   . Breast cancer Neg Hx     No Known Allergies  Current Outpatient Medications on File Prior to Visit  Medication Sig Dispense Refill  . levothyroxine  (SYNTHROID) 100 MCG tablet TAKE ONE TABLET BY MOUTH DAILY BEFORE BREAKFAST  90 tablet 0   No current facility-administered medications on file prior to visit.    BP (!) 153/86 (BP Location: Right Arm, Patient Position: Sitting, Cuff Size: Small)   Pulse 82   Temp 97.6 F (36.4 C) (Temporal)   Resp 16   Ht 5\' 5"  (1.651 m)   Wt 193 lb (87.5 kg)   SpO2 98%   BMI 32.12 kg/m        Objective:   Physical Exam Constitutional:      Appearance: She is well-developed.  HENT:     Right Ear: Tympanic membrane and ear canal normal.     Left Ear: Tympanic membrane and ear canal normal.  Neck:     Thyroid: No thyromegaly.  Cardiovascular:     Rate and Rhythm: Normal rate and regular rhythm.     Heart sounds: Normal heart sounds. No murmur.  Pulmonary:     Effort: Pulmonary effort is normal. No respiratory distress.     Breath sounds: Normal breath sounds. No wheezing.  Musculoskeletal:     Cervical back: Neck supple.  Skin:    General: Skin is warm and dry.  Neurological:     Mental Status: She is alert and oriented to person, place, and time.  Psychiatric:        Behavior: Behavior normal.        Thought Content: Thought content normal.        Judgment: Judgment normal.           Assessment & Plan:  History of tobacco abuse-will refer to pulmonology for lung cancer screening.  Paresthesia-we will check B12 and folate level.  Dizziness-nonspecific.  Could be related to blood pressure. Will monitor for now.  Fatigue- We will also check TSH, CMet, and CBC.  Elevated blood pressure reading-I have asked the patient to check her blood pressure once daily for several days at work.  She is to send me these readings via MyChart for further evaluation.  30 minutes spent on today's face to face exam.

## 2019-07-27 NOTE — Patient Instructions (Signed)
Please complete lab work prior to leaving. Check blood pressure at work next week and send me your readings via mychart.

## 2019-07-28 LAB — CBC WITH DIFFERENTIAL/PLATELET
Absolute Monocytes: 714 cells/uL (ref 200–950)
Basophils Absolute: 51 cells/uL (ref 0–200)
Basophils Relative: 0.5 %
Eosinophils Absolute: 102 cells/uL (ref 15–500)
Eosinophils Relative: 1 %
HCT: 47.6 % — ABNORMAL HIGH (ref 35.0–45.0)
Hemoglobin: 16 g/dL — ABNORMAL HIGH (ref 11.7–15.5)
Lymphs Abs: 2917 cells/uL (ref 850–3900)
MCH: 29.1 pg (ref 27.0–33.0)
MCHC: 33.6 g/dL (ref 32.0–36.0)
MCV: 86.7 fL (ref 80.0–100.0)
MPV: 10.8 fL (ref 7.5–12.5)
Monocytes Relative: 7 %
Neutro Abs: 6416 cells/uL (ref 1500–7800)
Neutrophils Relative %: 62.9 %
Platelets: 354 10*3/uL (ref 140–400)
RBC: 5.49 10*6/uL — ABNORMAL HIGH (ref 3.80–5.10)
RDW: 12.1 % (ref 11.0–15.0)
Total Lymphocyte: 28.6 %
WBC: 10.2 10*3/uL (ref 3.8–10.8)

## 2019-07-28 LAB — B12 AND FOLATE PANEL
Folate: 15.2 ng/mL
Vitamin B-12: 548 pg/mL (ref 200–1100)

## 2019-07-28 LAB — COMPREHENSIVE METABOLIC PANEL
AG Ratio: 1.5 (calc) (ref 1.0–2.5)
ALT: 22 U/L (ref 6–29)
AST: 19 U/L (ref 10–35)
Albumin: 4.6 g/dL (ref 3.6–5.1)
Alkaline phosphatase (APISO): 84 U/L (ref 37–153)
BUN: 22 mg/dL (ref 7–25)
CO2: 26 mmol/L (ref 20–32)
Calcium: 10.7 mg/dL — ABNORMAL HIGH (ref 8.6–10.4)
Chloride: 101 mmol/L (ref 98–110)
Creat: 0.94 mg/dL (ref 0.50–1.05)
Globulin: 3 g/dL (calc) (ref 1.9–3.7)
Glucose, Bld: 104 mg/dL — ABNORMAL HIGH (ref 65–99)
Potassium: 4.2 mmol/L (ref 3.5–5.3)
Sodium: 138 mmol/L (ref 135–146)
Total Bilirubin: 0.5 mg/dL (ref 0.2–1.2)
Total Protein: 7.6 g/dL (ref 6.1–8.1)

## 2019-07-28 LAB — TSH: TSH: 3.34 mIU/L

## 2019-07-29 ENCOUNTER — Other Ambulatory Visit: Payer: Self-pay | Admitting: Endocrinology

## 2019-07-30 ENCOUNTER — Other Ambulatory Visit: Payer: Self-pay | Admitting: Family

## 2019-07-30 ENCOUNTER — Telehealth: Payer: Self-pay | Admitting: Family

## 2019-07-30 DIAGNOSIS — D582 Other hemoglobinopathies: Secondary | ICD-10-CM

## 2019-07-30 DIAGNOSIS — Z720 Tobacco use: Secondary | ICD-10-CM

## 2019-07-30 MED ORDER — LEVOTHYROXINE SODIUM 100 MCG PO TABS: 100.0000 ug | ORAL_TABLET | Freq: Every day | ORAL | 0 refills | Status: DC

## 2019-07-30 NOTE — Telephone Encounter (Signed)
Please advise pt that her calcium was actually elevated. If she is taking a calcium supplement, she should stop.   Blood count a bit high- she might have been a bit dehydrated that day- make sure she drinks plenty of water.  Repeat labs as pended in 1 month.

## 2019-08-01 NOTE — Telephone Encounter (Signed)
Left detail message for patient to call about abnormal labs.

## 2019-08-01 NOTE — Telephone Encounter (Signed)
Per patient she is not taking calcium or multivitamins with calcium.  She has an appointment scheduled for 08-24-2019 for a physical.

## 2019-08-02 ENCOUNTER — Other Ambulatory Visit: Payer: Self-pay | Admitting: *Deleted

## 2019-08-02 DIAGNOSIS — Z87891 Personal history of nicotine dependence: Secondary | ICD-10-CM

## 2019-08-20 ENCOUNTER — Ambulatory Visit (INDEPENDENT_AMBULATORY_CARE_PROVIDER_SITE_OTHER): Payer: BC Managed Care – PPO | Admitting: Acute Care

## 2019-08-20 ENCOUNTER — Ambulatory Visit
Admission: RE | Admit: 2019-08-20 | Discharge: 2019-08-20 | Disposition: A | Discharge status: Home / Self Care | Payer: BC Managed Care – PPO | Source: Ambulatory Visit | Attending: Acute Care | Admitting: Acute Care

## 2019-08-20 ENCOUNTER — Other Ambulatory Visit: Payer: Self-pay

## 2019-08-20 ENCOUNTER — Encounter: Payer: Self-pay | Admitting: Acute Care

## 2019-08-20 VITALS — BP 120/80 | HR 80 | Temp 97.7°F | Ht 65.0 in | Wt 196.0 lb

## 2019-08-20 DIAGNOSIS — Z87891 Personal history of nicotine dependence: Secondary | ICD-10-CM

## 2019-08-20 NOTE — Patient Instructions (Signed)
Thank you for participating in the [facility]. It was our pleasure to meet you today. We will call you with the results of your scan within the next few days. Your scan will be assigned a Lung RADS category score by the physicians reading the scans.  This Lung RADS score determines follow up scanning.  See below for description of categories, and follow up screening recommendations. We will be in touch to schedule your follow up screening annually or based on recommendations of our providers. We will fax a copy of your scan results to your Primary Care Physician, or the physician who referred you to the program, to ensure they have the results. Please call the office if you have any questions or concerns regarding your scanning experience or results.  Our office number is [phone]. Please speak with Denise Phelps, RN. She is our Lung Cancer Screening RN. If she is unavailable when you call, please have the office staff send her a message. She will return your call at her earliest convenience. Remember, if your scan is normal, we will scan you annually as long as you continue to meet the criteria for the program. (Age 55-77, Current smoker or smoker who has quit within the last 15 years). If you are a smoker, remember, quitting is the single most powerful action that you can take to decrease your risk of lung cancer and other pulmonary, breathing related problems. We know quitting is hard, and we are here to help.  Please let us know if there is anything we can do to help you meet your goal of quitting. If you are a former smoker, congratulations. We are proud of you! Remain smoke free! Remember you can refer friends or family members through the number above.  We will screen them to make sure they meet criteria for the program. Thank you for helping us take better care of you by participating in Lung Screening.  Lung RADS Categories:  Lung RADS 1: no nodules  or definitely non-concerning nodules.  Recommendation is for a repeat annual scan in 12 months.  Lung RADS 2:  nodules that are non-concerning in appearance and behavior with a very low likelihood of becoming an active cancer. Recommendation is for a repeat annual scan in 12 months.  Lung RADS 3: nodules that are probably non-concerning , includes nodules with a low likelihood of becoming an active cancer.  Recommendation is for a 6-month repeat screening scan. Often noted after an upper respiratory illness. We will be in touch to make sure you have no questions, and to schedule your 6-month scan.  Lung RADS 4 A: nodules with concerning findings, recommendation is most often for a follow up scan in 3 months or additional testing based on our provider's assessment of the scan. We will be in touch to make sure you have no questions and to schedule the recommended 3 month follow up scan.  Lung RADS 4 B:  indicates findings that are concerning. We will be in touch with you to schedule additional diagnostic testing based on our provider's  assessment of the scan.   

## 2019-08-20 NOTE — Progress Notes (Signed)
Shared Decision Making Visit Lung Cancer Screening Program [phone])   Eligibility:  Age 54 y.o.  Pack Years Smoking History Calculation 30 pack year smoking history (# packs/per year x # years smoked)  Recent History of coughing up blood  no  Unexplained weight loss? no ( >Than 15 pounds within the last 6 months )  Prior History Lung / other cancer no (Diagnosis within the last 5 years already requiring surveillance chest CT Scans).  Smoking Status Former Smoker  Former Smokers: Years since quit: 10 years  Quit Date: 2011  Visit Components:  Discussion included one or more decision making aids. yes  Discussion included risk/benefits of screening. yes  Discussion included potential follow up diagnostic testing for abnormal scans. yes  Discussion included meaning and risk of over diagnosis. yes  Discussion included meaning and risk of False Positives. yes  Discussion included meaning of total radiation exposure. yes  Counseling Included:  Importance of adherence to annual lung cancer LDCT screening. yes  Impact of comorbidities on ability to participate in the program. yes  Ability and willingness to under diagnostic treatment. yes  Smoking Cessation Counseling:  Current Smokers:   Discussed importance of smoking cessation. yes  Information about tobacco cessation classes and interventions provided to patient. yes  Patient provided with "ticket" for LDCT Scan. yes  Symptomatic Patient. no  Counseling  Diagnosis Code: Tobacco Use Z72.0  Asymptomatic Patient yes  Counseling (Intermediate counseling: > three minutes counseling) K1601  Former Smokers:   Discussed the importance of maintaining cigarette abstinence. yes  Diagnosis Code: Personal History of Nicotine Dependence. U93.235  Information about tobacco cessation classes and interventions provided to patient. Yes  Patient provided with "ticket" for LDCT Scan. yes  Written Order for Lung Cancer  Screening with LDCT placed in Epic. Yes (CT Chest Lung Cancer Screening Low Dose W/O CM) [id number] Z12.2-Screening of respiratory organs Z87.891-Personal history of nicotine dependence  I spent 25 minutes of face to face time with Ms. Keehan discussing the risks and benefits of lung cancer screening. We viewed a power point together that explained in detail the above noted topics. We took the time to pause the power point at intervals to allow for questions to be asked and answered to ensure understanding. We discussed that she had taken the single most powerful action possible to decrease her risk of developing lung cancer when she quit smoking. I counseled her to remain smoke free, and to contact me if she ever had the desire to smoke again so that I can provide resources and tools to help support the effort to remain smoke free. We discussed the time and location of the scan, and that either  Doroteo Glassman RN or I will call with the results within  24-48 hours of receiving them. She has my card and contact information in the event she needs to speak with me, in addition to a copy of the power point we reviewed as a resource. She verbalized understanding of all of the above and had no further questions upon leaving the office.     I explained to the patient that there has been a high incidence of coronary artery disease noted on these exams. I explained that this is a non-gated exam therefore degree or severity cannot be determined. This patient is not on statin therapy. I have asked the patient to follow-up with their PCP regarding any incidental finding of coronary artery disease and management with diet or medication as they feel is clinically  indicated. The patient verbalized understanding of the above and had no further questions.     Bevelyn Ngo, NP 08/20/2019 4:06 PM

## 2019-08-22 NOTE — Progress Notes (Signed)
Please call patient and let them  know their  low dose Ct was read as a Lung RADS 1, negative study: no nodules or definitely benign nodules. Radiology recommendation is for a repeat LDCT in 12 months. .Please let them  know we will order and schedule their  annual screening scan for 08/2020.  Please place order for annual  screening scan for  08/2020 and fax results to PCP. Thanks so much.

## 2019-08-24 ENCOUNTER — Encounter: Payer: Self-pay | Admitting: Family

## 2019-08-24 ENCOUNTER — Other Ambulatory Visit: Payer: Self-pay

## 2019-08-24 ENCOUNTER — Ambulatory Visit (INDEPENDENT_AMBULATORY_CARE_PROVIDER_SITE_OTHER): Payer: BC Managed Care – PPO | Admitting: Family

## 2019-08-24 VITALS — BP 138/86 | HR 76 | Temp 96.7°F | Resp 16 | Ht 65.0 in | Wt 194.0 lb

## 2019-08-24 DIAGNOSIS — E2839 Other primary ovarian failure: Secondary | ICD-10-CM | POA: Diagnosis not present

## 2019-08-24 DIAGNOSIS — H9193 Unspecified hearing loss, bilateral: Secondary | ICD-10-CM

## 2019-08-24 DIAGNOSIS — Z0000 Encounter for general adult medical examination without abnormal findings: Secondary | ICD-10-CM | POA: Diagnosis not present

## 2019-08-24 NOTE — Progress Notes (Signed)
Subjective:    Patient ID: Melissa Gibson, female    DOB: 1966-05-27, 54 y.o.   MRN: [id number]  HPI  Patient is a 54 yr old female who presents today for cpx.  Patient presents today for complete physical.  Immunizations:  tdap 2012, mammo 04/06/19 Diet: reports that diet is fair Exercise: walks at lunch Colonoscopy: 2016, due 2026 Dexa: due Pap Smear:  04/02/19 Mammogram: 04/02/19 Vision:  Up to date- will be due soon Dental: soon.   Paresthesia- last visit b12 and folate levels were WNL.  Elevated blood pressure reading- follow up pressure better today. BP Readings from Last 3 Encounters:  08/24/19 138/86  08/20/19 120/80  07/27/19 (!) 153/86   Hypothyroid- maintained on synthroid .  Lab Results  Component Value Date   TSH 3.34 07/27/2019      Review of Systems  Constitutional: Negative for unexpected weight change.  HENT: Positive for hearing loss (reports hearing not always great). Negative for rhinorrhea.   Eyes: Negative for visual disturbance.  Respiratory: Negative for cough and shortness of breath.   Cardiovascular: Negative for chest pain.  Gastrointestinal: Negative for blood in stool, constipation and diarrhea.  Genitourinary: Negative for dyspareunia and frequency.  Musculoskeletal: Negative for arthralgias and myalgias.  Skin: Negative for rash.  Neurological: Negative for headaches.  Hematological: Negative for adenopathy.  Psychiatric/Behavioral:       Denies depression/anxiety   Past Medical History:  Diagnosis Date  . Abnormal Pap smear of cervix    -age 61/unsure if she had any treatment/03-03-15 LSIL:Pos HR HPV;LEEP 04-14-15 LGSIL w/neg margins  . Dysmenorrhea   . Hyperlipidemia   . Hypothyroid 10/05/10  . Menorrhagia      Social History   Socioeconomic History  . Marital status: Married    Spouse name: Not on file  . Number of children: Not on file  . Years of education: Not on file  . Highest education level: Not on  file  Occupational History  . Occupation: Airline pilot  Tobacco Use  . Smoking status: Former Smoker    Packs/day: 1.00    Years: 30.00    Pack years: 30.00    Types: Cigarettes    Quit date: 07/05/2008    Years since quitting: 11.1  . Smokeless tobacco: Never Used  . Tobacco comment: quit in 2010  Substance and Sexual Activity  . Alcohol use: Yes    Alcohol/week: 1.0 - 2.0 standard drinks    Types: 1 - 2 Standard drinks or equivalent per week  . Drug use: No  . Sexual activity: Yes    Partners: Male    Birth control/protection: Surgical    Comment: BTL, Ablation  Other Topics Concern  . Not on file  Social History Narrative   Regular Exercise -  NO   Former smoker- quit 5 years ago.   internet sales   Married   No children   1 year of college   Enjoys crafts, Presenter, broadcasting.   Social Determinants of Health   Financial Resource Strain:   . Difficulty of Paying Living Expenses: Not on file  Food Insecurity:   . Worried About Programme researcher, broadcasting/film/video in the Last Year: Not on file  . Ran Out of Food in the Last Year: Not on file  Transportation Needs:   . Lack of Transportation (Medical): Not on file  . Lack of Transportation (Non-Medical): Not on file  Physical Activity:   . Days of Exercise per Week: Not on file  .  Minutes of Exercise per Session: Not on file  Stress:   . Feeling of Stress : Not on file  Social Connections:   . Frequency of Communication with Friends and Family: Not on file  . Frequency of Social Gatherings with Friends and Family: Not on file  . Attends Religious Services: Not on file  . Active Member of Clubs or Organizations: Not on file  . Attends Banker Meetings: Not on file  . Marital Status: Not on file  Intimate Partner Violence:   . Fear of Current or Ex-Partner: Not on file  . Emotionally Abused: Not on file  . Physically Abused: Not on file  . Sexually Abused: Not on file    Past Surgical History:  Procedure Laterality Date  .  COLONOSCOPY W/ BIOPSIES  07/10/2014   Diverticulosis of large intestines and hyperplastic ployp X 1 recheck in 10 yrs.  Marland Kitchen LEEP  04/14/15   CIN I  . NOVASURE ABLATION  11/2010  . TUBAL LIGATION  2006    Family History  Problem Relation Age of Onset  . Heart attack Father   . Hypertension Father   . Heart failure Father   . Hyperlipidemia Father   . Lung cancer Sister 14       lung cancer  . Infertility Sister   . Thyroid disease Sister   . Cancer Sister        lung; died at 53yr  . COPD Mother   . Hyperlipidemia Mother   . Heart attack Brother 52  . COPD Brother   . Cirrhosis Brother        caused his death, ETOH abuse  . Thyroid disease Sister        hypothyroid  . Cancer Maternal Grandmother        lymph nodes  . Arthritis Maternal Grandfather   . Breast cancer Neg Hx     No Known Allergies  Current Outpatient Medications on File Prior to Visit  Medication Sig Dispense Refill  . levothyroxine (SYNTHROID) 100 MCG tablet Take 1 tablet (100 mcg total) by mouth daily before breakfast. 90 tablet 0   No current facility-administered medications on file prior to visit.    BP 138/86 (BP Location: Right Arm, Patient Position: Sitting, Cuff Size: Small)   Pulse 76   Temp (!) 96.7 F (35.9 C) (Temporal)   Resp 16   Ht 5\' 5"  (1.651 m)   Wt 194 lb (88 kg)   SpO2 100%   BMI 32.28 kg/m        Objective:   Physical Exam  Physical Exam  Constitutional: She is oriented to person, place, and time. She appears well-developed and well-nourished. No distress.  HENT:  Head: Normocephalic and atraumatic.  Right Ear: Tympanic membrane and ear canal normal.  Left Ear: Tympanic membrane and ear canal normal.  Mouth/Throat: not examined-pt wearing mask Eyes: Pupils are equal, round, and reactive to light. No scleral icterus.  Neck: Normal range of motion. No thyromegaly present.  Cardiovascular: Normal rate and regular rhythm.   No murmur heard. Pulmonary/Chest: Effort normal  and breath sounds normal. No respiratory distress. He has no wheezes. She has no rales. She exhibits no tenderness.  Abdominal: Soft. Bowel sounds are normal. She exhibits no distension and no mass. There is no tenderness. There is no rebound and no guarding.  Musculoskeletal: She exhibits no edema.  Lymphadenopathy:    She has no cervical adenopathy.  Neurological: She is alert and oriented to  person, place, and time. She has normal patellar reflexes. She exhibits normal muscle tone. Coordination normal.  Skin: Skin is warm and dry.  Psychiatric: She has a normal mood and affect. Her behavior is normal. Judgment and thought content normal.  Breast/pelvic: deferred           Assessment & Plan:   Preventative care- discussed diet/exercise and weight loss.  Refer for bone density.  Mammogram up to date.  Colo and pap up to date. She complains of hearing loss- will refer to audiology for further evaluation.       This visit occurred during the SARS-CoV-2 public health emergency.  Safety protocols were in place, including screening questions prior to the visit, additional usage of staff PPE, and extensive cleaning of exam room while observing appropriate contact time as indicated for disinfecting solutions.    Assessment & Plan:

## 2019-08-24 NOTE — Patient Instructions (Signed)
Please complete lab work prior to leaving.    Preventive Care 24-56 Years Old, Female Preventive care refers to visits with your health care provider and lifestyle choices that can promote health and wellness. This includes:  A yearly physical exam. This may also be called an annual well check.  Regular dental visits and eye exams.  Immunizations.  Screening for certain conditions.  Healthy lifestyle choices, such as eating a healthy diet, getting regular exercise, not using drugs or products that contain nicotine and tobacco, and limiting alcohol use. What can I expect for my preventive care visit? Physical exam Your health care provider will check your:  Height and weight. This may be used to calculate body mass index (BMI), which tells if you are at a healthy weight.  Heart rate and blood pressure.  Skin for abnormal spots. Counseling Your health care provider may ask you questions about your:  Alcohol, tobacco, and drug use.  Emotional well-being.  Home and relationship well-being.  Sexual activity.  Eating habits.  Work and work Statistician.  Method of birth control.  Menstrual cycle.  Pregnancy history. What immunizations do I need?  Influenza (flu) vaccine  This is recommended every year. Tetanus, diphtheria, and pertussis (Tdap) vaccine  You may need a Td booster every 10 years. Varicella (chickenpox) vaccine  You may need this if you have not been vaccinated. Zoster (shingles) vaccine  You may need this after age 32. Measles, mumps, and rubella (MMR) vaccine  You may need at least one dose of MMR if you were born in 1957 or later. You may also need a second dose. Pneumococcal conjugate (PCV13) vaccine  You may need this if you have certain conditions and were not previously vaccinated. Pneumococcal polysaccharide (PPSV23) vaccine  You may need one or two doses if you smoke cigarettes or if you have certain conditions. Meningococcal conjugate  (MenACWY) vaccine  You may need this if you have certain conditions. Hepatitis A vaccine  You may need this if you have certain conditions or if you travel or work in places where you may be exposed to hepatitis A. Hepatitis B vaccine  You may need this if you have certain conditions or if you travel or work in places where you may be exposed to hepatitis B. Haemophilus influenzae type b (Hib) vaccine  You may need this if you have certain conditions. Human papillomavirus (HPV) vaccine  If recommended by your health care provider, you may need three doses over 6 months. You may receive vaccines as individual doses or as more than one vaccine together in one shot (combination vaccines). Talk with your health care provider about the risks and benefits of combination vaccines. What tests do I need? Blood tests  Lipid and cholesterol levels. These may be checked every 5 years, or more frequently if you are over 72 years old.  Hepatitis C test.  Hepatitis B test. Screening  Lung cancer screening. You may have this screening every year starting at age 24 if you have a 30-pack-year history of smoking and currently smoke or have quit within the past 15 years.  Colorectal cancer screening. All adults should have this screening starting at age 44 and continuing until age 17. Your health care provider may recommend screening at age 66 if you are at increased risk. You will have tests every 1-10 years, depending on your results and the type of screening test.  Diabetes screening. This is done by checking your blood sugar (glucose) after you have  not eaten for a while (fasting). You may have this done every 1-3 years.  Mammogram. This may be done every 1-2 years. Talk with your health care provider about when you should start having regular mammograms. This may depend on whether you have a family history of breast cancer.  BRCA-related cancer screening. This may be done if you have a family  history of breast, ovarian, tubal, or peritoneal cancers.  Pelvic exam and Pap test. This may be done every 3 years starting at age 62. Starting at age 39, this may be done every 5 years if you have a Pap test in combination with an HPV test. Other tests  Sexually transmitted disease (STD) testing.  Bone density scan. This is done to screen for osteoporosis. You may have this scan if you are at high risk for osteoporosis. Follow these instructions at home: Eating and drinking  Eat a diet that includes fresh fruits and vegetables, whole grains, lean protein, and low-fat dairy.  Take vitamin and mineral supplements as recommended by your health care provider.  Do not drink alcohol if: ? Your health care provider tells you not to drink. ? You are pregnant, may be pregnant, or are planning to become pregnant.  If you drink alcohol: ? Limit how much you have to 0-1 drink a day. ? Be aware of how much alcohol is in your drink. In the [place] one drink equals one 12 oz bottle of beer (355 mL), one 5 oz glass of wine (148 mL), or one 1 oz glass of hard liquor (44 mL). Lifestyle  Take daily care of your teeth and gums.  Stay active. Exercise for at least 30 minutes on 5 or more days each week.  Do not use any products that contain nicotine or tobacco, such as cigarettes, e-cigarettes, and chewing tobacco. If you need help quitting, ask your health care provider.  If you are sexually active, practice safe sex. Use a condom or other form of birth control (contraception) in order to prevent pregnancy and STIs (sexually transmitted infections).  If told by your health care provider, take low-dose aspirin daily starting at age 13. What's next?  Visit your health care provider once a year for a well check visit.  Ask your health care provider how often you should have your eyes and teeth checked.  Stay up to date on all vaccines. This information is not intended to replace advice given to you  by your health care provider. Make sure you discuss any questions you have with your health care provider. Document Revised: 03/02/2018 Document Reviewed: 03/02/2018 Elsevier Patient Education  2020 [employer].

## 2019-08-25 LAB — LIPID PANEL
Cholesterol: 262 mg/dL — ABNORMAL HIGH (ref <200)
HDL: 50 mg/dL (ref >=50)
LDL Cholesterol (Calc): 171 mg/dL (calc) — ABNORMAL HIGH
Non-HDL Cholesterol (Calc): 212 mg/dL (calc) — ABNORMAL HIGH (ref <130)
Total CHOL/HDL Ratio: 5.2 (calc) — ABNORMAL HIGH (ref <5.0)
Triglycerides: 243 mg/dL — ABNORMAL HIGH (ref <150)

## 2019-08-26 ENCOUNTER — Other Ambulatory Visit: Payer: Self-pay | Admitting: Family

## 2019-08-26 DIAGNOSIS — E785 Hyperlipidemia, unspecified: Secondary | ICD-10-CM

## 2019-08-26 NOTE — Telephone Encounter (Signed)
Reviewed her cholesterol and it is quite high.  I would recommend that she work on a low fat/low cholesterol diet, exercise and weight loss. I would also recommend that she start atorvastatin once daily.   Repeat FLP in 6 weeks.

## 2019-08-29 MED ORDER — ATORVASTATIN CALCIUM 20 MG PO TABS: 20.0000 mg | ORAL_TABLET | Freq: Every day | ORAL | 1 refills | Status: DC

## 2019-08-29 NOTE — Telephone Encounter (Signed)
Patient  advised of results and provider's advise. She will like to start statin. Rx sent to the pharmacy.Scheduled for labs 10-08-2019

## 2019-08-30 ENCOUNTER — Other Ambulatory Visit: Payer: Self-pay | Admitting: *Deleted

## 2019-08-30 ENCOUNTER — Encounter: Payer: Self-pay | Admitting: *Deleted

## 2019-08-30 DIAGNOSIS — Z87891 Personal history of nicotine dependence: Secondary | ICD-10-CM

## 2019-08-30 NOTE — Progress Notes (Signed)
Called patient a few times but mail box was full.

## 2019-08-30 NOTE — Progress Notes (Signed)
Lvm for pt to call back. 

## 2019-08-30 NOTE — Progress Notes (Signed)
Please advise patient that I reviewed her CT scan and there is no sign of lung cancer.

## 2019-09-24 ENCOUNTER — Encounter: Payer: Self-pay | Admitting: Certified Nurse Midwife

## 2019-10-08 ENCOUNTER — Ambulatory Visit (HOSPITAL_BASED_OUTPATIENT_CLINIC_OR_DEPARTMENT_OTHER)
Admission: RE | Admit: 2019-10-08 | Discharge: 2019-10-08 | Disposition: A | Discharge status: Home / Self Care | Payer: BC Managed Care – PPO | Source: Ambulatory Visit | Attending: Family | Admitting: Family

## 2019-10-08 ENCOUNTER — Other Ambulatory Visit (INDEPENDENT_AMBULATORY_CARE_PROVIDER_SITE_OTHER): Payer: BC Managed Care – PPO

## 2019-10-08 ENCOUNTER — Other Ambulatory Visit: Payer: Self-pay

## 2019-10-08 DIAGNOSIS — M85852 Other specified disorders of bone density and structure, left thigh: Secondary | ICD-10-CM | POA: Diagnosis not present

## 2019-10-08 DIAGNOSIS — E2839 Other primary ovarian failure: Secondary | ICD-10-CM | POA: Insufficient documentation

## 2019-10-08 DIAGNOSIS — Z0000 Encounter for general adult medical examination without abnormal findings: Secondary | ICD-10-CM | POA: Diagnosis not present

## 2019-10-08 DIAGNOSIS — D582 Other hemoglobinopathies: Secondary | ICD-10-CM | POA: Diagnosis not present

## 2019-10-08 DIAGNOSIS — E785 Hyperlipidemia, unspecified: Secondary | ICD-10-CM | POA: Diagnosis not present

## 2019-10-08 DIAGNOSIS — H9313 Tinnitus, bilateral: Secondary | ICD-10-CM | POA: Diagnosis not present

## 2019-10-08 LAB — CBC WITH DIFFERENTIAL/PLATELET
Basophils Absolute: 0 10*3/uL (ref 0.0–0.1)
Basophils Relative: 0.6 % (ref 0.0–3.0)
Eosinophils Absolute: 0.1 10*3/uL (ref 0.0–0.7)
Eosinophils Relative: 1.4 % (ref 0.0–5.0)
HCT: 43.4 % (ref 36.0–46.0)
Hemoglobin: 14.6 g/dL (ref 12.0–15.0)
Lymphocytes Relative: 35.4 % (ref 12.0–46.0)
Lymphs Abs: 2.4 10*3/uL (ref 0.7–4.0)
MCHC: 33.7 g/dL (ref 30.0–36.0)
MCV: 88.7 fl (ref 78.0–100.0)
Monocytes Absolute: 0.5 10*3/uL (ref 0.1–1.0)
Monocytes Relative: 8.1 % (ref 3.0–12.0)
Neutro Abs: 3.7 10*3/uL (ref 1.4–7.7)
Neutrophils Relative %: 54.5 % (ref 43.0–77.0)
Platelets: 291 10*3/uL (ref 150.0–400.0)
RBC: 4.89 Mil/uL (ref 3.87–5.11)
RDW: 12.8 % (ref 11.5–15.5)
WBC: 6.8 10*3/uL (ref 4.0–10.5)

## 2019-10-08 LAB — LIPID PANEL
Cholesterol: 154 mg/dL (ref 0–200)
HDL: 50.9 mg/dL (ref >39.00)
LDL Cholesterol: 80 mg/dL (ref 0–99)
NonHDL: 103.42
Total CHOL/HDL Ratio: 3
Triglycerides: 118 mg/dL (ref 0.0–149.0)
VLDL: 23.6 mg/dL (ref 0.0–40.0)

## 2019-10-08 LAB — FERRITIN: Ferritin: 45.3 ng/mL (ref 10.0–291.0)

## 2019-10-09 ENCOUNTER — Encounter: Payer: Self-pay | Admitting: Family

## 2019-10-09 DIAGNOSIS — M8580 Other specified disorders of bone density and structure, unspecified site: Secondary | ICD-10-CM | POA: Insufficient documentation

## 2019-10-09 LAB — PTH, INTACT AND CALCIUM
Calcium: 9.4 mg/dL (ref 8.6–10.4)
PTH: 84 pg/mL — ABNORMAL HIGH (ref 14–64)

## 2019-10-10 ENCOUNTER — Telehealth: Payer: Self-pay | Admitting: Family

## 2019-10-10 DIAGNOSIS — E213 Hyperparathyroidism, unspecified: Secondary | ICD-10-CM

## 2019-10-10 NOTE — Telephone Encounter (Signed)
Please advise pt that lab work suggests that her parathyroid is mildly overactive.  Parathyroid helps to regulate calcium metabolism.  Her follow up calcium is normal however.  I would like her to see endocrinology for consulation.

## 2019-10-15 NOTE — Telephone Encounter (Signed)
Patient advised of results and provider's comments, she is already a patient of Dr. Everardo All. She will mention this to him on her next appointment.

## 2019-10-16 NOTE — Telephone Encounter (Signed)
please contact patient:  Please move up next appt to next available.   

## 2019-10-16 NOTE — Telephone Encounter (Signed)
Next Appt With Internal Medicine Romero Belling, MD) 10/22/2019 at 8:15 AM

## 2019-10-16 NOTE — Telephone Encounter (Signed)
Dr. Everardo All, please see below. She is not scheduled to see you until 11/21.  I will defer to you if you would like to see her sooner for mild hyperparathyroidism.

## 2019-10-18 ENCOUNTER — Other Ambulatory Visit: Payer: Self-pay

## 2019-10-22 ENCOUNTER — Ambulatory Visit: Payer: BC Managed Care – PPO | Admitting: Endocrinology

## 2019-10-22 ENCOUNTER — Encounter: Payer: Self-pay | Admitting: Endocrinology

## 2019-10-22 ENCOUNTER — Other Ambulatory Visit: Payer: Self-pay

## 2019-10-22 DIAGNOSIS — E213 Hyperparathyroidism, unspecified: Secondary | ICD-10-CM | POA: Diagnosis not present

## 2019-10-22 HISTORY — DX: Hyperparathyroidism, unspecified: E21.3

## 2019-10-22 LAB — VITAMIN D 25 HYDROXY (VIT D DEFICIENCY, FRACTURES): VITD: 32.99 ng/mL (ref 30.00–100.00)

## 2019-10-22 NOTE — Patient Instructions (Addendum)
Your blood pressure is high today.  Please see your primary care provider soon, to have it rechecked Blood tests are requested for you today.  We'll let you know about the results.     

## 2019-10-22 NOTE — Progress Notes (Signed)
Subjective:    Patient ID: Melissa Gibson, female    DOB: [birth year], 54 y.o.   MRN: [id number]  HPI Pt returns for f/u of hypothyroidism (dx'ed 2014; TSH has been variable, despite no change in synthroid; US showed right thyroid nodule, 1.8 cm; f/u US in 2020 was unchanged, and met criteria for a 1-2 year follow-up ).  pt states fatigue and difficulty with concentration.  She does not notice the nodule.   Pt was noted to have hypercalcemia in 2019.  she has never had urolithiasis, sarcoidosis, cancer, PUD, pancreatitis, or bony fracture.  she does not take vitamin-A supplement.  She recently started taking caltrate 1/d, which contains vit-D.  Pt denies taking antacids, Li++, or HCTZ.   Past Medical History:  Diagnosis Date  . Abnormal Pap smear of cervix    -age 72/unsure if she had any treatment/03-03-15 LSIL:Pos HR HPV;LEEP 04-14-15 LGSIL w/neg margins  . Dysmenorrhea   . Hyperlipidemia   . Hypothyroid 10/05/10  . Menorrhagia     Past Surgical History:  Procedure Laterality Date  . COLONOSCOPY W/ BIOPSIES  07/10/2014   Diverticulosis of large intestines and hyperplastic ployp X 1 recheck in 10 yrs.  Marland Kitchen LEEP  04/14/15   CIN I  . NOVASURE ABLATION  11/2010  . TUBAL LIGATION  2006    Social History   Socioeconomic History  . Marital status: Married    Spouse name: Not on file  . Number of children: Not on file  . Years of education: Not on file  . Highest education level: Not on file  Occupational History  . Occupation: Press photographer  Tobacco Use  . Smoking status: Former Smoker    Packs/day: 1.00    Years: 30.00    Pack years: 30.00    Types: Cigarettes    Quit date: 07/05/2008    Years since quitting: 11.3  . Smokeless tobacco: Never Used  . Tobacco comment: quit in 2010  Substance and Sexual Activity  . Alcohol use: Yes    Alcohol/week: 1.0 - 2.0 standard drinks    Types: 1 - 2 Standard drinks or equivalent per week  . Drug use: No  . Sexual activity: Yes    Partners:  Male    Birth control/protection: Surgical    Comment: BTL, Ablation  Other Topics Concern  . Not on file  Social History Narrative   Regular Exercise -  NO   Former smoker- quit 5 years ago.   internet sales   Married   No children   1 year of college   Enjoys crafts, Haematologist.   Social Determinants of Health   Financial Resource Strain:   . Difficulty of Paying Living Expenses:   Food Insecurity:   . Worried About Charity fundraiser in the Last Year:   . Arboriculturist in the Last Year:   Transportation Needs:   . Film/video editor (Medical):   Marland Kitchen Lack of Transportation (Non-Medical):   Physical Activity:   . Days of Exercise per Week:   . Minutes of Exercise per Session:   Stress:   . Feeling of Stress :   Social Connections:   . Frequency of Communication with Friends and Family:   . Frequency of Social Gatherings with Friends and Family:   . Attends Religious Services:   . Active Member of Clubs or Organizations:   . Attends Archivist Meetings:   Marland Kitchen Marital Status:   Intimate Partner Violence:   .  Fear of Current or Ex-Partner:   . Emotionally Abused:   Marland Kitchen Physically Abused:   . Sexually Abused:     Current Outpatient Medications on File Prior to Visit  Medication Sig Dispense Refill  . atorvastatin (LIPITOR) 20 MG tablet Take 1 tablet (20 mg total) by mouth daily. 90 tablet 1  . levothyroxine (SYNTHROID) 100 MCG tablet Take 1 tablet (100 mcg total) by mouth daily before breakfast. 90 tablet 0   No current facility-administered medications on file prior to visit.    No Known Allergies  Family History  Problem Relation Age of Onset  . Heart attack Father   . Hypertension Father   . Heart failure Father   . Hyperlipidemia Father   . Lung cancer Sister 70       lung cancer  . Infertility Sister   . Thyroid disease Sister   . Cancer Sister        lung; died at 69yr . COPD Mother   . Hyperlipidemia Mother   . Heart attack Brother 593  . COPD Brother   . Cirrhosis Brother        caused his death, ETOH abuse  . Thyroid disease Sister        hypothyroid  . Cancer Maternal Grandmother        lymph nodes  . Arthritis Maternal Grandfather   . Breast cancer Neg Hx   . Hypercalcemia Neg Hx     BP (!) 142/90   Pulse 78   Ht _0  (1.651 m)   Wt 193 lb (87.5 kg)   SpO2 98%   BMI 32.12 kg/m    Review of Systems     Objective:   Physical Exam VITAL SIGNS:  See vs page.  GENERAL: no distress.  NECK: There is no palpable thyroid enlargement.  The right thyroid nodule is palpable, approx 2 cm.  No palpable lymphadenopathy at the anterior neck.    DEXA:  Worse T-score was-1.8 (LFN)    Lab Results  Component Value Date   TSH 3.34 07/27/2019   25-OH vit-D=33    Assessment & Plan:  HTN: is noted today Vit-D def: better Hyperparathyroidism, new to me.  Mild.  Uncertain if primary or secondary.     Patient Instructions  Your blood pressure is high today.  Please see your primary care provider soon, to have it rechecked.  Blood tests are requested for you today.  We'll let you know about the results.

## 2019-10-23 LAB — PTH, INTACT AND CALCIUM
Calcium: 10.1 mg/dL (ref 8.6–10.4)
PTH: 37 pg/mL (ref 14–64)

## 2019-11-24 ENCOUNTER — Other Ambulatory Visit: Payer: Self-pay | Admitting: Endocrinology

## 2019-11-26 MED ORDER — LEVOTHYROXINE SODIUM 100 MCG PO TABS: 100.0000 ug | ORAL_TABLET | Freq: Every day | ORAL | 0 refills | Status: DC

## 2019-12-11 ENCOUNTER — Encounter: Payer: Self-pay | Admitting: Family

## 2019-12-12 MED ORDER — ATORVASTATIN CALCIUM 20 MG PO TABS: 20.0000 mg | ORAL_TABLET | Freq: Every day | ORAL | 1 refills | Status: DC

## 2019-12-27 DIAGNOSIS — Z20828 Contact with and (suspected) exposure to other viral communicable diseases: Secondary | ICD-10-CM | POA: Diagnosis not present

## 2020-02-22 ENCOUNTER — Other Ambulatory Visit: Payer: Self-pay

## 2020-02-22 ENCOUNTER — Ambulatory Visit: Payer: BC Managed Care – PPO | Admitting: Family

## 2020-02-22 ENCOUNTER — Encounter: Payer: Self-pay | Admitting: Family

## 2020-02-22 VITALS — BP 124/78 | HR 84 | Resp 16 | Ht 65.0 in | Wt 186.0 lb

## 2020-02-22 DIAGNOSIS — E785 Hyperlipidemia, unspecified: Secondary | ICD-10-CM | POA: Diagnosis not present

## 2020-02-22 DIAGNOSIS — E039 Hypothyroidism, unspecified: Secondary | ICD-10-CM

## 2020-02-22 DIAGNOSIS — R002 Palpitations: Secondary | ICD-10-CM | POA: Diagnosis not present

## 2020-02-22 NOTE — Patient Instructions (Signed)
Please complete lab work prior to leaving.   

## 2020-02-22 NOTE — Progress Notes (Signed)
Subjective:    Patient ID: Melissa Gibson, female    DOB: Jan 07, 1966, 54 y.o.   MRN: [id number]  HPI  Patient is a 54 yr old female who presents today for follow up.  Hypothyroid- maintained on synthroid.  Lab Results  Component Value Date   TSH 3.34 07/27/2019   Hyperlipidemia- denies myalgia. Reports good compliance with lipitor.   Lab Results  Component Value Date   CHOL 154 10/08/2019   HDL 50.90 10/08/2019   LDLCALC 80 10/08/2019   LDLDIRECT 125.0 01/29/2010   TRIG 118.0 10/08/2019   CHOLHDL 3 10/08/2019   Reports rare palpitations-have been occurring since she was a teen. Occur about 3x a year. Last about a minute. Reports that a deep breath with often stop the palpitations.    Review of Systems   See HPI  Past Medical History:  Diagnosis Date  . Abnormal Pap smear of cervix    -age 46/unsure if she had any treatment/03-03-15 LSIL:Pos HR HPV;LEEP 04-14-15 LGSIL w/neg margins  . Dysmenorrhea   . Hyperlipidemia   . Hypothyroid 10/05/10  . Menorrhagia      Social History   Socioeconomic History  . Marital status: Married    Spouse name: Not on file  . Number of children: Not on file  . Years of education: Not on file  . Highest education level: Not on file  Occupational History  . Occupation: Airline pilot  Tobacco Use  . Smoking status: Former Smoker    Packs/day: 1.00    Years: 30.00    Pack years: 30.00    Types: Cigarettes    Quit date: 07/05/2008    Years since quitting: 11.6  . Smokeless tobacco: Never Used  . Tobacco comment: quit in 2010  Substance and Sexual Activity  . Alcohol use: Yes    Alcohol/week: 1.0 - 2.0 standard drink    Types: 1 - 2 Standard drinks or equivalent per week  . Drug use: No  . Sexual activity: Yes    Partners: Male    Birth control/protection: Surgical    Comment: BTL, Ablation  Other Topics Concern  . Not on file  Social History Narrative   Regular Exercise -  NO   Former smoker- quit 5 years ago.   internet  sales   Married   No children   1 year of college   Enjoys crafts, Presenter, broadcasting.   Social Determinants of Health   Financial Resource Strain:   . Difficulty of Paying Living Expenses: Not on file  Food Insecurity:   . Worried About Programme researcher, broadcasting/film/video in the Last Year: Not on file  . Ran Out of Food in the Last Year: Not on file  Transportation Needs:   . Lack of Transportation (Medical): Not on file  . Lack of Transportation (Non-Medical): Not on file  Physical Activity:   . Days of Exercise per Week: Not on file  . Minutes of Exercise per Session: Not on file  Stress:   . Feeling of Stress : Not on file  Social Connections:   . Frequency of Communication with Friends and Family: Not on file  . Frequency of Social Gatherings with Friends and Family: Not on file  . Attends Religious Services: Not on file  . Active Member of Clubs or Organizations: Not on file  . Attends Banker Meetings: Not on file  . Marital Status: Not on file  Intimate Partner Violence:   . Fear of Current or Ex-Partner:  Not on file  . Emotionally Abused: Not on file  . Physically Abused: Not on file  . Sexually Abused: Not on file    Past Surgical History:  Procedure Laterality Date  . COLONOSCOPY W/ BIOPSIES  07/10/2014   Diverticulosis of large intestines and hyperplastic ployp X 1 recheck in 10 yrs.  Marland Kitchen LEEP  04/14/15   CIN I  . NOVASURE ABLATION  11/2010  . TUBAL LIGATION  2006    Family History  Problem Relation Age of Onset  . Heart attack Father   . Hypertension Father   . Heart failure Father   . Hyperlipidemia Father   . Lung cancer Sister 21       lung cancer  . Infertility Sister   . Thyroid disease Sister   . Cancer Sister        lung; died at 77yr  . COPD Mother   . Hyperlipidemia Mother   . Heart attack Brother 52  . COPD Brother   . Cirrhosis Brother        caused his death, ETOH abuse  . Thyroid disease Sister        hypothyroid  . Cancer Maternal Grandmother         lymph nodes  . Arthritis Maternal Grandfather   . Breast cancer Neg Hx   . Hypercalcemia Neg Hx     No Known Allergies  Current Outpatient Medications on File Prior to Visit  Medication Sig Dispense Refill  . atorvastatin (LIPITOR) 20 MG tablet Take 1 tablet (20 mg total) by mouth daily. 90 tablet 1  . levothyroxine (SYNTHROID) 100 MCG tablet Take 1 tablet (100 mcg total) by mouth daily before breakfast. 90 tablet 0   No current facility-administered medications on file prior to visit.    BP 124/78 (BP Location: Left Arm, Patient Position: Sitting, Cuff Size: Normal)   Pulse 84   Resp 16   Ht 5\' 5"  (1.651 m)   Wt 186 lb (84.4 kg)   SpO2 98%   BMI 30.95 kg/m        Objective:   Physical Exam Constitutional:      Appearance: She is well-developed.  Neck:     Thyroid: No thyromegaly.  Cardiovascular:     Rate and Rhythm: Normal rate and regular rhythm.     Heart sounds: Normal heart sounds. No murmur heard.   Pulmonary:     Effort: Pulmonary effort is normal. No respiratory distress.     Breath sounds: Normal breath sounds. No wheezing.  Musculoskeletal:     Cervical back: Neck supple.  Skin:    General: Skin is warm and dry.  Neurological:     Mental Status: She is alert and oriented to person, place, and time.  Psychiatric:        Behavior: Behavior normal.        Thought Content: Thought content normal.        Judgment: Judgment normal.           Assessment & Plan:  Hyperlipidemia- lipids at goal. Continue statin.  Hypothyroid- continues synthroid- obtain follow up TSH.  Hx of palpitations- EKG tracing is personally reviewed.  EKG notes NSR.  No acute changes.  Only occurs a few times a year and lasts about 1 minute.  Will check TSH, CBC, electrolytes. Suspect PVC's.  Advised pt if frequency increases to let me know and we will plan referral to cardiology.   This visit occurred during the SARS-CoV-2 public health  emergency.  Safety protocols  were in place, including screening questions prior to the visit, additional usage of staff PPE, and extensive cleaning of exam room while observing appropriate contact time as indicated for disinfecting solutions.

## 2020-02-29 ENCOUNTER — Telehealth: Payer: Self-pay | Admitting: Endocrinology

## 2020-02-29 ENCOUNTER — Other Ambulatory Visit: Payer: Self-pay

## 2020-02-29 DIAGNOSIS — E041 Nontoxic single thyroid nodule: Secondary | ICD-10-CM

## 2020-02-29 MED ORDER — LEVOTHYROXINE SODIUM 100 MCG PO TABS: 100.0000 ug | ORAL_TABLET | Freq: Every day | ORAL | 0 refills | Status: DC

## 2020-02-29 NOTE — Telephone Encounter (Signed)
Outpatient Medication Detail   Disp Refills Start End   levothyroxine (SYNTHROID) 100 MCG tablet 90 tablet 0 02/29/2020    Sig - Route: Take 1 tablet (100 mcg total) by mouth daily before breakfast. - Oral   Sent to pharmacy as: levothyroxine (SYNTHROID) 100 MCG tablet   E-Prescribing Status: Receipt confirmed by pharmacy (02/29/2020  9:02 AM EDT)

## 2020-02-29 NOTE — Telephone Encounter (Signed)
Medication Refill Request  Did you call your pharmacy and request this refill first? Yes  . If patient has not contacted pharmacy first, instruct them to do so for future refills.  . Remind them that contacting the pharmacy for their refill is the quickest method to get the refill.  . Refill policy also stated that it will take anywhere between 24-72 hours to receive the refill.    Name of medication? Levothyroxine - as patient awaits her new patient appointment for transfer of care with Dr Lonzo Cloud, she said she does need a refill   Is this a 90 day supply? yes  Name and location of pharmacy? [id number] - [place], [place] - [address] Phone:  [phone]  Fax:  [phone]

## 2020-04-07 ENCOUNTER — Ambulatory Visit: Payer: BC Managed Care – PPO | Admitting: Certified Nurse Midwife

## 2020-04-08 ENCOUNTER — Ambulatory Visit: Payer: BC Managed Care – PPO | Admitting: Internal Medicine

## 2020-04-08 ENCOUNTER — Encounter: Payer: Self-pay | Admitting: Internal Medicine

## 2020-04-08 ENCOUNTER — Other Ambulatory Visit: Payer: Self-pay

## 2020-04-08 VITALS — BP 142/88 | HR 90 | Ht 65.0 in | Wt 191.0 lb

## 2020-04-08 DIAGNOSIS — E211 Secondary hyperparathyroidism, not elsewhere classified: Secondary | ICD-10-CM | POA: Diagnosis not present

## 2020-04-08 DIAGNOSIS — E041 Nontoxic single thyroid nodule: Secondary | ICD-10-CM

## 2020-04-08 DIAGNOSIS — E039 Hypothyroidism, unspecified: Secondary | ICD-10-CM | POA: Insufficient documentation

## 2020-04-08 DIAGNOSIS — E213 Hyperparathyroidism, unspecified: Secondary | ICD-10-CM | POA: Diagnosis not present

## 2020-04-08 HISTORY — DX: Nontoxic single thyroid nodule: E04.1

## 2020-04-08 NOTE — Patient Instructions (Signed)
You are on levothyroxine - which is your thyroid hormone supplement. You MUST take this consistently. ° °You should take this first thing in the morning on an empty stomach with water. You should not take it with other medications. Wait 30min to 1hr prior to eating. If you are taking any vitamins - please take these in the evening.  ° °If you miss a dose, please take your missed dose the following day (double the dose for that day). °You should have a pill box for ONLY levothyroxine on your bedside table to help you remember to take your medications. ° ° ° °24-Hour Urine Collection ° °You will be collecting your urine for a 24-hour period of time. °Your timer starts with your first urine of the morning (For example - If you first pee at 9AM, your timer will start at 9AM) °Throw away your first urine of the morning °Collect your urine every time you pee for the next 24 hours °STOP your urine collection 24 hours after you started the collection (For example - You would stop at 9AM the day after you started) ° °

## 2020-04-08 NOTE — Progress Notes (Signed)
Name: Melissa Gibson  MRN/ DOB: [id number], 19-Jul-1965    Age/ Sex: 54 y.o., female     PCP: Melissa Craze, NP   Reason for Endocrinology Evaluation: Right thyroid nodule      Initial Endocrinology Clinic Visit: 05/19/2017    PATIENT IDENTIFIER: [name] is a 55 y.o., female with a past medical history of dyslipidemia and hypothyroidism. She has followed with [facility] since 05/19/2017 for consultative assistance with management of her hypothyroid/thyroid nodule .   HISTORICAL SUMMARY: The patient was first diagnosed with hypercalcemia in 2019, with a serum max of 10.7 mg/dL in 01/1244 ( non-corrected) . Her PTH level has been fluctuating between 38 - 84 pg/mL  She has not official diagnosis of renal stones  NO hx of bone fractures  DXA showed low bone density 10/2019   No FH of renal stones or hypercalcemia   Her labs have been normal since 2021  THYROID HISTORY: She has been diagnosed with right thyroid nodule since 2018. This has been stable .  She has been on levothyroxine since 2016   SUBJECTIVE:     Today (04/08/2020):  Melissa Gibson is here for a follow up on hypercalcemia and MNG.   She is on Caltrate Vitamin D 1 tablet daily . On low dietary calcium     Weight has been increasing  Has occasional constipation  Denies local neck symptoms    HISTORY:  Past Medical History:  Past Medical History:  Diagnosis Date   Abnormal Pap smear of cervix    -age 72/unsure if she had any treatment/03-03-15 LSIL:Pos HR HPV;LEEP 04-14-15 LGSIL w/neg margins   Dysmenorrhea    Hyperlipidemia    Hypothyroid 10/05/10   Menorrhagia    Past Surgical History:  Past Surgical History:  Procedure Laterality Date   COLONOSCOPY W/ BIOPSIES  07/10/2014   Diverticulosis of large intestines and hyperplastic ployp X 1 recheck in 10 yrs.   LEEP  04/14/15   CIN I   NOVASURE ABLATION  11/2010   TUBAL LIGATION  2006    Social History:   reports that she quit smoking about 11 years ago. Her smoking use included cigarettes. She has a 30.00 pack-year smoking history. She has never used smokeless tobacco. She reports current alcohol use of about 1.0 - 2.0 standard drink of alcohol per week. She reports that she does not use drugs. Family History:  Family History  Problem Relation Age of Onset   Heart attack Father    Hypertension Father    Heart failure Father    Hyperlipidemia Father    Lung cancer Sister 76       lung cancer   Infertility Sister    Thyroid disease Sister    Cancer Sister        lung; died at 56yr   COPD Mother    Hyperlipidemia Mother    Heart attack Brother 54   COPD Brother    Cirrhosis Brother        caused his death, ETOH abuse   Thyroid disease Sister        hypothyroid   Cancer Maternal Grandmother        lymph nodes   Arthritis Maternal Grandfather    Breast cancer Neg Hx    Hypercalcemia Neg Hx      HOME MEDICATIONS: Allergies as of 04/08/2020   No Known Allergies     Medication List       Accurate as of April 08, 2020  4:28 PM. If you have any questions, ask your nurse or doctor.        atorvastatin 20 MG tablet Commonly known as: LIPITOR Take 1 tablet (20 mg total) by mouth daily.   levothyroxine 100 MCG tablet Commonly known as: SYNTHROID Take 1 tablet (100 mcg total) by mouth daily before breakfast.         OBJECTIVE:   PHYSICAL EXAM: VS: BP (!) 142/88    Pulse 90    Ht 5\' 5"  (1.651 m)    Wt 191 lb (86.6 kg)    SpO2 98%    BMI 31.78 kg/m    EXAM: General: Pt appears well and is in NAD  Neck: General: Supple without adenopathy. Thyroid: Thyroid size normal.  No goiter or nodules appreciated.  Lungs: Clear with good BS bilat with no rales, rhonchi, or wheezes  Heart: Auscultation: RRR.  Abdomen: Normoactive bowel sounds, soft, nontender, without masses or organomegaly palpable  Extremities:  BL LE: No pretibial edema normal ROM and  strength.  Skin: Hair: Texture and amount normal with gender appropriate distribution Skin Inspection: No rashes Skin Palpation: Skin temperature, texture, and thickness normal to palpation  Neuro: Cranial nerves: II - XII grossly intact  Motor: Normal strength throughout DTRs: 2+ and symmetric in UE without delay in relaxation phase  Mental Status: Judgment, insight: Intact Orientation: Oriented to time, place, and person Mood and affect: No depression, anxiety, or agitation     DATA REVIEWED: Results for ASHEY, TRAMONTANA (MRN [name]) as of 04/10/2020 07:52  Ref. Range 04/08/2020 15:19  Sodium Latest Ref Range: 135 - 146 mmol/L 140  Potassium Latest Ref Range: 3.5 - 5.3 mmol/L 4.4  Chloride Latest Ref Range: 98 - 110 mmol/L 103  CO2 Latest Ref Range: 20 - 32 mmol/L 27  Glucose Latest Ref Range: 65 - 99 mg/dL 90  BUN Latest Ref Range: 7 - 25 mg/dL 21  Creatinine Latest Ref Range: 0.50 - 1.05 mg/dL 06/08/2020  Calcium Latest Ref Range: 8.6 - 10.4 mg/dL 9.6  BUN/Creatinine Ratio Latest Ref Range: 6 - 22 (calc) NOT APPLICABLE  Calcium Ionized Latest Ref Range: 4.8 - 5.6 mg/dL 5.1  Vitamin D, 2.70 Latest Ref Range: 30 - 100 ng/mL 30  TSH Latest Units: mIU/L 2.55  Albumin MSPROF Latest Ref Range: 3.6 - 5.1 g/dL 4.2    DXA [id number] The BMD measured at Femur Neck Left is 0.790 g/cm2 with a T-score of -1.8. This patient is considered osteopenic according to World Health Organization [facility]) criteria. The scan quality is good.  ASSESSMENT / PLAN / RECOMMENDATIONS:   1. Right thyroid nodule:  - Pt denies any local neck symptoms  - Thyroid ultrasound had shows stable thyroid nodule  - Will repeat thyroid ultrasound this year  2. Hypothyroidism:  - Pt is clinically and biochemically euthyroid    Medication Levothyroxine 100 mcg daily   3. Pseudo hypercalcemia :   - I personally don't believe she had a true hypercalcemia, the first time with serum calcium of 10.3 mg/dL ,  her albumin was 4.3 g/dL with a corrected calcium of 10.06 mg/dL, which is normal. The second time she had an elevated serum calcium, there was no concomitant albumin . But repeat serum calcium and ionized calcium has been normal.   4. Secondary hyperparathyroidism :    - Pt had elevated parathyroid hormone at 84 pg/mL , with a serum calcium of 9.4 mg/dL , no concomitant vitamin D but has low  vitamin D prior to that and normal Vitamin D level a few weeks after that.  - I suspect this may have been due to vitamin D insufficieny or due to low calcium absorption but now its normal .    F/U in 1 yr    Signed electronically by: Lyndle Herrlich, MD  [facility]  [facility] [address] [place] [place], [place] [address] Phone: [phone] FAX: [phone]      CC: Anwita, Mencer, NP [address] [place] [address] Phone: [phone]  Fax: [phone]   Return to Endocrinology clinic as below: Future Appointments  Date Time Provider Department Center  06/23/2020  7:40 AM Melissa Craze, NP [facility]  04/10/2021  8:00 AM Melissa Craze, NP [facility]

## 2020-04-09 LAB — PARATHYROID HORMONE, INTACT (NO CA): PTH: 37 pg/mL (ref 14–64)

## 2020-04-09 LAB — TSH: TSH: 2.55 mIU/L

## 2020-04-09 LAB — BASIC METABOLIC PANEL
BUN: 21 mg/dL (ref 7–25)
CO2: 27 mmol/L (ref 20–32)
Calcium: 9.6 mg/dL (ref 8.6–10.4)
Chloride: 103 mmol/L (ref 98–110)
Creat: 0.93 mg/dL (ref 0.50–1.05)
Glucose, Bld: 90 mg/dL (ref 65–99)
Potassium: 4.4 mmol/L (ref 3.5–5.3)
Sodium: 140 mmol/L (ref 135–146)

## 2020-04-09 LAB — ALBUMIN: Albumin: 4.2 g/dL (ref 3.6–5.1)

## 2020-04-09 LAB — VITAMIN D 25 HYDROXY (VIT D DEFICIENCY, FRACTURES): Vit D, 25-Hydroxy: 30 ng/mL (ref 30–100)

## 2020-04-09 LAB — CALCIUM, IONIZED: Calcium, Ion: 5.1 mg/dL (ref 4.8–5.6)

## 2020-04-10 DIAGNOSIS — E211 Secondary hyperparathyroidism, not elsewhere classified: Secondary | ICD-10-CM

## 2020-04-10 HISTORY — DX: Secondary hyperparathyroidism, not elsewhere classified: E21.1

## 2020-04-10 MED ORDER — LEVOTHYROXINE SODIUM 100 MCG PO TABS: 100.0000 ug | ORAL_TABLET | Freq: Every day | ORAL | 3 refills | Status: DC

## 2020-04-14 ENCOUNTER — Ambulatory Visit (HOSPITAL_BASED_OUTPATIENT_CLINIC_OR_DEPARTMENT_OTHER): Payer: BC Managed Care – PPO

## 2020-04-25 ENCOUNTER — Ambulatory Visit (HOSPITAL_BASED_OUTPATIENT_CLINIC_OR_DEPARTMENT_OTHER)
Admission: RE | Admit: 2020-04-25 | Discharge: 2020-04-25 | Disposition: A | Discharge status: Home / Self Care | Payer: BC Managed Care – PPO | Source: Ambulatory Visit | Attending: Internal Medicine | Admitting: Internal Medicine

## 2020-04-25 ENCOUNTER — Other Ambulatory Visit: Payer: Self-pay

## 2020-04-25 DIAGNOSIS — E041 Nontoxic single thyroid nodule: Secondary | ICD-10-CM | POA: Diagnosis not present

## 2020-05-20 ENCOUNTER — Ambulatory Visit: Payer: BC Managed Care – PPO | Admitting: Endocrinology

## 2020-06-09 ENCOUNTER — Other Ambulatory Visit: Payer: Self-pay | Admitting: Family

## 2020-06-09 ENCOUNTER — Other Ambulatory Visit: Payer: Self-pay | Admitting: Obstetrics and Gynecology

## 2020-06-09 DIAGNOSIS — Z1231 Encounter for screening mammogram for malignant neoplasm of breast: Secondary | ICD-10-CM

## 2020-06-17 ENCOUNTER — Telehealth: Payer: Self-pay | Admitting: Family

## 2020-06-17 NOTE — Telephone Encounter (Signed)
Medication: levothyroxine (SYNTHROID) 100 MCG tablet    Has the patient contacted their pharmacy? No. (If no, request that the patient contact the pharmacy for the refill.) (If yes, when and what did the pharmacy advise?)  Preferred Pharmacy (with phone number or street name): [id number] - [place], [place] - [address]  [address] [place] [address]  Phone:  [phone] Fax:  [phone]  DEA #:  --  Agent: Please be advised that RX refills may take up to 3 business days. We ask that you follow-up with your pharmacy.

## 2020-06-18 NOTE — Telephone Encounter (Signed)
Patient's rx was sent in October to [facility] for 90 days with 3 refills. Patient to contact pharmacy

## 2020-06-23 ENCOUNTER — Other Ambulatory Visit: Payer: Self-pay

## 2020-06-23 ENCOUNTER — Ambulatory Visit (INDEPENDENT_AMBULATORY_CARE_PROVIDER_SITE_OTHER): Payer: BC Managed Care – PPO | Admitting: Family

## 2020-06-23 ENCOUNTER — Encounter: Payer: Self-pay | Admitting: Family

## 2020-06-23 VITALS — BP 129/81 | HR 66 | Temp 98.4°F | Resp 16 | Ht 64.5 in | Wt 189.0 lb

## 2020-06-23 DIAGNOSIS — E785 Hyperlipidemia, unspecified: Secondary | ICD-10-CM

## 2020-06-23 DIAGNOSIS — Z0000 Encounter for general adult medical examination without abnormal findings: Secondary | ICD-10-CM | POA: Diagnosis not present

## 2020-06-23 LAB — LIPID PANEL
Cholesterol: 176 mg/dL (ref 0–200)
HDL: 55.5 mg/dL (ref >39.00)
LDL Cholesterol: 96 mg/dL (ref 0–99)
NonHDL: 120.38
Total CHOL/HDL Ratio: 3
Triglycerides: 121 mg/dL (ref 0.0–149.0)
VLDL: 24.2 mg/dL (ref 0.0–40.0)

## 2020-06-23 NOTE — Progress Notes (Signed)
Subjective:    Patient ID: Melissa Gibson, female    DOB: 23-Oct-1965, 54 y.o.   MRN: [id number]  HPI  Patient presents today for complete physical.  Immunizations:  Pfizer x 2, tdap is due in April.  Flu shot up to date. She had a Radiographer, therapeutic booster in November. Declines tetanus today. Diet:good Wt Readings from Last 3 Encounters:  06/23/20 189 lb (85.7 kg)  04/08/20 191 lb (86.6 kg)  02/22/20 186 lb (84.4 kg)  Exercise:not as regularly- some walking at lunch time Colonoscopy: 2016- due 2026 Dexa:2021 Pap Smear: 9/20  Mammogram: scheduled for 1/18- due Dental: up to date Vision: up to date     Review of Systems  Constitutional: Negative for unexpected weight change.  HENT: Negative for hearing loss and rhinorrhea.   Eyes: Negative for visual disturbance.  Respiratory: Negative for cough and shortness of breath.   Cardiovascular: Negative for chest pain.  Gastrointestinal: Negative for blood in stool, constipation and diarrhea.  Genitourinary: Negative for dysuria, frequency and hematuria.  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 Negative for myalgias.  Skin: Negative for rash.  Neurological: Negative for headaches.  Hematological: Negative for adenopathy.  Psychiatric/Behavioral:       Denies depression/anxiety        Past Medical History:  Diagnosis Date  . Abnormal Pap smear of cervix    -age 27/unsure if she had any treatment/03-03-15 LSIL:Pos HR HPV;LEEP 04-14-15 LGSIL w/neg margins  . Dysmenorrhea   . Hyperlipidemia   . Hypothyroid 10/05/10  . Menorrhagia      Social History   Socioeconomic History  . Marital status: Married    Spouse name: Not on file  . Number of children: Not on file  . Years of education: Not on file  . Highest education level: Not on file  Occupational History  . Occupation: Airline pilot  Tobacco Use  . Smoking status: Former Smoker    Packs/day: 1.00    Years: 30.00    Pack years: 30.00     Types: Cigarettes    Quit date: 07/05/2008    Years since quitting: 11.9  . Smokeless tobacco: Never Used  . Tobacco comment: quit in 2010  Substance and Sexual Activity  . Alcohol use: Yes    Alcohol/week: 1.0 - 2.0 standard drink    Types: 1 - 2 Standard drinks or equivalent per week  . Drug use: No  . Sexual activity: Yes    Partners: Male    Birth control/protection: Surgical    Comment: BTL, Ablation  Other Topics Concern  . Not on file  Social History Narrative   Regular Exercise -  NO   Former smoker- quit 5 years ago.   internet sales   Married   No children   1 year of college   Enjoys crafts, Presenter, broadcasting.   Social Determinants of Health   Financial Resource Strain: Not on file  Food Insecurity: Not on file  Transportation Needs: Not on file  Physical Activity: Not on file  Stress: Not on file  Social Connections: Not on file  Intimate Partner Violence: Not on file    Past Surgical History:  Procedure Laterality Date  . COLONOSCOPY W/ BIOPSIES  07/10/2014   Diverticulosis of large intestines and hyperplastic ployp X 1 recheck in 10 yrs.  Marland Kitchen LEEP  04/14/15   CIN I  . NOVASURE ABLATION  11/2010  . TUBAL LIGATION  2006    Family History  Problem Relation Age of Onset  . Heart attack Father   . Hypertension Father   . Heart failure Father   . Hyperlipidemia Father   . Lung cancer Sister 53       lung cancer  . Infertility Sister   . Thyroid disease Sister   . Cancer Sister        lung; died at 59yr  . COPD Mother        on hospice  . Hyperlipidemia Mother   . Heart attack Brother 52  . COPD Brother   . Cirrhosis Brother        caused his death, ETOH abuse  . Thyroid disease Sister        hypothyroid  . Cancer Maternal Grandmother        lymph nodes  . Arthritis Maternal Grandfather   . Breast cancer Neg Hx   . Hypercalcemia Neg Hx     No Known Allergies  Current Outpatient Medications on File Prior to Visit  Medication Sig Dispense Refill   . atorvastatin (LIPITOR) 20 MG tablet Take 1 tablet (20 mg total) by mouth daily. 90 tablet 1  . levothyroxine (SYNTHROID) 100 MCG tablet Take 1 tablet (100 mcg total) by mouth daily before breakfast. 90 tablet 3   No current facility-administered medications on file prior to visit.    BP 129/81 (BP Location: Right Arm, Patient Position: Sitting, Cuff Size: Small)   Pulse 66   Temp 98.4 F (36.9 C) (Oral)   Resp 16   Ht 5' 4.5" (1.638 m)   Wt 189 lb (85.7 kg)   SpO2 98%   BMI 31.94 kg/m    Objective:   Physical Exam  Physical Exam  Constitutional: She is oriented to person, place, and time. She appears well-developed and well-nourished. No distress.  HENT:  Head: Normocephalic and atraumatic.  Right Ear: Tympanic membrane and ear canal normal.  Left Ear: Tympanic membrane and ear canal normal.  Mouth/Throat: not examined- pt wearing mask Eyes: Pupils are equal, round, and reactive to light. No scleral icterus.  Neck: Normal range of motion. No thyromegaly present.  Cardiovascular: Normal rate and regular rhythm.   No murmur heard. Pulmonary/Chest: Effort normal and breath sounds normal. No respiratory distress. He has no wheezes. She has no rales. She exhibits no tenderness.  Abdominal: Soft. Bowel sounds are normal. She exhibits no distension and no mass. There is no tenderness. There is no rebound and no guarding.  Musculoskeletal: She exhibits no edema.  Lymphadenopathy:    She has no cervical adenopathy.  Neurological: She is alert and oriented to person, place, and time. She has normal patellar reflexes. She exhibits normal muscle tone. Coordination normal.  Skin: Skin is warm and dry.  Psychiatric: She has a normal mood and affect. Her behavior is normal. Judgment and thought content normal.  Breasts: Examined lying Right: Without masses, retractions, discharge or axillary adenopathy.  Left: Without masses, retractions, discharge or axillary adenopathy.   Breast/pelvic: deferred.            Assessment & Plan:    Preventative care- discussed healthy diet, exercise and weight loss. She will send me the date of her covid booster shot. Pap up to date. Mammo is scheduled. Bone density up to date. Declines tetanus booster today.  This visit occurred during the SARS-CoV-2 public health emergency.  Safety protocols were in place, including screening questions prior to the visit, additional usage of staff PPE, and extensive cleaning of exam  room while observing appropriate contact time as indicated for disinfecting solutions.        Assessment & Plan:

## 2020-06-23 NOTE — Patient Instructions (Signed)
Please complete lab work prior to leaving.  Continue your work on healthy diet, regular exercise and weight loss.

## 2020-07-22 ENCOUNTER — Ambulatory Visit: Payer: BC Managed Care – PPO

## 2020-07-23 DIAGNOSIS — Z20822 Contact with and (suspected) exposure to covid-19: Secondary | ICD-10-CM | POA: Diagnosis not present

## 2020-08-29 ENCOUNTER — Ambulatory Visit: Payer: BC Managed Care – PPO

## 2020-09-11 ENCOUNTER — Other Ambulatory Visit: Payer: Self-pay | Admitting: *Deleted

## 2020-09-11 DIAGNOSIS — Z87891 Personal history of nicotine dependence: Secondary | ICD-10-CM

## 2020-09-19 ENCOUNTER — Ambulatory Visit (HOSPITAL_BASED_OUTPATIENT_CLINIC_OR_DEPARTMENT_OTHER)
Admission: RE | Admit: 2020-09-19 | Discharge: 2020-09-19 | Disposition: A | Discharge status: Home / Self Care | Payer: BC Managed Care – PPO | Source: Ambulatory Visit | Attending: Family | Admitting: Family

## 2020-09-19 ENCOUNTER — Other Ambulatory Visit: Payer: Self-pay

## 2020-09-19 DIAGNOSIS — Z87891 Personal history of nicotine dependence: Secondary | ICD-10-CM | POA: Diagnosis not present

## 2020-09-30 ENCOUNTER — Other Ambulatory Visit: Payer: Self-pay

## 2020-09-30 ENCOUNTER — Ambulatory Visit: Payer: BC Managed Care – PPO | Admitting: Family

## 2020-09-30 VITALS — BP 143/85 | HR 66 | Temp 98.1°F | Resp 17 | Ht 64.5 in | Wt 184.0 lb

## 2020-09-30 DIAGNOSIS — R519 Headache, unspecified: Secondary | ICD-10-CM | POA: Diagnosis not present

## 2020-09-30 DIAGNOSIS — R233 Spontaneous ecchymoses: Secondary | ICD-10-CM | POA: Diagnosis not present

## 2020-09-30 DIAGNOSIS — I10 Essential (primary) hypertension: Secondary | ICD-10-CM

## 2020-09-30 DIAGNOSIS — Z8249 Family history of ischemic heart disease and other diseases of the circulatory system: Secondary | ICD-10-CM

## 2020-09-30 LAB — CBC WITH DIFFERENTIAL/PLATELET
Basophils Absolute: 0 10*3/uL (ref 0.0–0.1)
Basophils Relative: 0.4 % (ref 0.0–3.0)
Eosinophils Absolute: 0.1 10*3/uL (ref 0.0–0.7)
Eosinophils Relative: 1.1 % (ref 0.0–5.0)
HCT: 45.7 % (ref 36.0–46.0)
Hemoglobin: 15.4 g/dL — ABNORMAL HIGH (ref 12.0–15.0)
Lymphocytes Relative: 34 % (ref 12.0–46.0)
Lymphs Abs: 2.4 10*3/uL (ref 0.7–4.0)
MCHC: 33.7 g/dL (ref 30.0–36.0)
MCV: 88 fl (ref 78.0–100.0)
Monocytes Absolute: 0.5 10*3/uL (ref 0.1–1.0)
Monocytes Relative: 7.3 % (ref 3.0–12.0)
Neutro Abs: 4 10*3/uL (ref 1.4–7.7)
Neutrophils Relative %: 57.2 % (ref 43.0–77.0)
Platelets: 286 10*3/uL (ref 150.0–400.0)
RBC: 5.19 Mil/uL — ABNORMAL HIGH (ref 3.87–5.11)
RDW: 12.7 % (ref 11.5–15.5)
WBC: 7 10*3/uL (ref 4.0–10.5)

## 2020-09-30 MED ORDER — AMLODIPINE BESYLATE 2.5 MG PO TABS: 2.5000 mg | ORAL_TABLET | Freq: Every day | ORAL | 3 refills | Status: DC

## 2020-09-30 NOTE — Progress Notes (Signed)
Subjective:    Patient ID: Melissa Gibson, female    DOB: 07-30-1965, 55 y.o.   MRN: [id number]  HPI  Patient is a 55 yr old female who presents today to discuss elevated blood pressure readings and lightheadedness. She brings with her today the following blood pressure readings:  153/85, 141/83, 145/80.  Also notes a pressure headache and some petechiae on her left cheek.   BP Readings from Last 3 Encounters:  09/30/20 (!) 143/85  06/23/20 129/81  04/08/20 (!) 142/88   She recently had a lung CT which noted atherosclerosis in the left circumflex coronary artery as well as aortic atherosclerosis. She would like "a stress test" due to these findings as well as CAD in her father.  Review of Systems    see HPI  Past Medical History:  Diagnosis Date  . Abnormal Pap smear of cervix    -age 89/unsure if she had any treatment/03-03-15 LSIL:Pos HR HPV;LEEP 04-14-15 LGSIL w/neg margins  . Dysmenorrhea   . Hyperlipidemia   . Hypothyroid 10/05/10  . Menorrhagia      Social History   Socioeconomic History  . Marital status: Married    Spouse name: Not on file  . Number of children: Not on file  . Years of education: Not on file  . Highest education level: Not on file  Occupational History  . Occupation: Airline pilot  Tobacco Use  . Smoking status: Former Smoker    Packs/day: 1.00    Years: 30.00    Pack years: 30.00    Types: Cigarettes    Quit date: 07/05/2008    Years since quitting: 12.2  . Smokeless tobacco: Never Used  . Tobacco comment: quit in 2010  Substance and Sexual Activity  . Alcohol use: Yes    Alcohol/week: 1.0 - 2.0 standard drink    Types: 1 - 2 Standard drinks or equivalent per week  . Drug use: No  . Sexual activity: Yes    Partners: Male    Birth control/protection: Surgical    Comment: BTL, Ablation  Other Topics Concern  . Not on file  Social History Narrative   Regular Exercise -  NO   Former smoker- quit 5 years ago.   internet sales   Married    No children   1 year of college   Enjoys crafts, Presenter, broadcasting.   Social Determinants of Health   Financial Resource Strain: Not on file  Food Insecurity: Not on file  Transportation Needs: Not on file  Physical Activity: Not on file  Stress: Not on file  Social Connections: Not on file  Intimate Partner Violence: Not on file    Past Surgical History:  Procedure Laterality Date  . COLONOSCOPY W/ BIOPSIES  07/10/2014   Diverticulosis of large intestines and hyperplastic ployp X 1 recheck in 10 yrs.  Marland Kitchen LEEP  04/14/15   CIN I  . NOVASURE ABLATION  11/2010  . TUBAL LIGATION  2006    Family History  Problem Relation Age of Onset  . Heart attack Father   . Hypertension Father   . Heart failure Father   . Hyperlipidemia Father   . Lung cancer Sister 33       lung cancer  . Infertility Sister   . Thyroid disease Sister   . Cancer Sister        lung; died at 75yr  . COPD Mother        on hospice  . Hyperlipidemia Mother   .  Heart attack Brother 52  . COPD Brother   . Cirrhosis Brother        caused his death, ETOH abuse  . Thyroid disease Sister        hypothyroid  . Cancer Maternal Grandmother        lymph nodes  . Arthritis Maternal Grandfather   . Breast cancer Neg Hx   . Hypercalcemia Neg Hx     No Known Allergies  Current Outpatient Medications on File Prior to Visit  Medication Sig Dispense Refill  . atorvastatin (LIPITOR) 20 MG tablet Take 1 tablet (20 mg total) by mouth daily. 90 tablet 1  . levothyroxine (SYNTHROID) 100 MCG tablet Take 1 tablet (100 mcg total) by mouth daily before breakfast. 90 tablet 3   No current facility-administered medications on file prior to visit.    BP (!) 143/85 (BP Location: Right Arm, Patient Position: Sitting, Cuff Size: Normal)   Pulse 66   Temp 98.1 F (36.7 C)   Resp 17   Ht 5' 4.5" (1.638 m)   Wt 184 lb (83.5 kg)   SpO2 99%   BMI 31.10 kg/m    Objective:   Physical Exam Constitutional:      Appearance: She is  well-developed.  Cardiovascular:     Rate and Rhythm: Normal rate and regular rhythm.     Heart sounds: Normal heart sounds. No murmur heard.   Pulmonary:     Effort: Pulmonary effort is normal. No respiratory distress.     Breath sounds: Normal breath sounds. No wheezing.  Skin:    General: Skin is warm and dry.     Comments: Few small petechiae noted on left cheek  Psychiatric:        Behavior: Behavior normal.        Thought Content: Thought content normal.        Judgment: Judgment normal.           Assessment & Plan:  HTN- bp mildly elevated- has had some higher readings at home. Will give trial of amlodipine 2.5mg  once daily.  Family hx of CAD- will refer to cardiology for further evaluation/work up.   Headache-likely related to blood pressure. Will see how she does with the amlodipine.  Petechiae- new. Cause unclear. Check CBC.  This visit occurred during the SARS-CoV-2 public health emergency.  Safety protocols were in place, including screening questions prior to the visit, additional usage of staff PPE, and extensive cleaning of exam room while observing appropriate contact time as indicated for disinfecting solutions.

## 2020-09-30 NOTE — Patient Instructions (Signed)
Please begin amlodipine 2.5mg  once daily for blood pressure.  Complete lab work prior to leaving.  You should be contacted about your referral to cardiology.

## 2020-10-03 NOTE — Progress Notes (Signed)
Please call patient and let them  know their  low dose Ct was read as a Lung RADS 2: nodules that are benign in appearance and behavior with a very low likelihood of becoming a clinically active cancer due to size or lack of growth. Recommendation per radiology is for a repeat LDCT in 12 months. .Please let them  know we will order and schedule their  annual screening scan for 09/2021. Please let them  know there was notation of CAD on their  scan.  Please remind the patient  that this is a non-gated exam therefore degree or severity of disease  cannot be determined. Please have them  follow up with their PCP regarding potential risk factor modification, dietary therapy or pharmacologic therapy if clinically indicated. Pt.  is currently on statin therapy. Please place order for annual  screening scan for  09/2021 and fax results to PCP. Thanks so much.  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 She is on a statin, but I do not see any cards notes in Epic. Please have her follow up with PCP regarding potential for cards consult .  Melissa, please refer to cards if you feel this is clinically appropriate. We will schedule her for a 12 month follow up LDCT. Thanks so much

## 2020-10-06 ENCOUNTER — Other Ambulatory Visit: Payer: Self-pay | Admitting: *Deleted

## 2020-10-06 DIAGNOSIS — Z87891 Personal history of nicotine dependence: Secondary | ICD-10-CM

## 2020-10-07 DIAGNOSIS — K625 Hemorrhage of anus and rectum: Secondary | ICD-10-CM

## 2020-10-07 DIAGNOSIS — N920 Excessive and frequent menstruation with regular cycle: Secondary | ICD-10-CM | POA: Insufficient documentation

## 2020-10-07 DIAGNOSIS — E785 Hyperlipidemia, unspecified: Secondary | ICD-10-CM | POA: Insufficient documentation

## 2020-10-07 DIAGNOSIS — K5900 Constipation, unspecified: Secondary | ICD-10-CM

## 2020-10-07 DIAGNOSIS — R87619 Unspecified abnormal cytological findings in specimens from cervix uteri: Secondary | ICD-10-CM | POA: Insufficient documentation

## 2020-10-07 DIAGNOSIS — N946 Dysmenorrhea, unspecified: Secondary | ICD-10-CM | POA: Insufficient documentation

## 2020-10-07 HISTORY — DX: Constipation, unspecified: K59.00

## 2020-10-07 HISTORY — DX: Hemorrhage of anus and rectum: K62.5

## 2020-10-09 ENCOUNTER — Ambulatory Visit: Payer: BC Managed Care – PPO | Admitting: Cardiology

## 2020-10-09 ENCOUNTER — Ambulatory Visit (INDEPENDENT_AMBULATORY_CARE_PROVIDER_SITE_OTHER): Payer: BC Managed Care – PPO

## 2020-10-09 ENCOUNTER — Other Ambulatory Visit: Payer: Self-pay

## 2020-10-09 ENCOUNTER — Encounter: Payer: Self-pay | Admitting: Cardiology

## 2020-10-09 VITALS — BP 140/84 | HR 78 | Ht 64.5 in | Wt 181.0 lb

## 2020-10-09 DIAGNOSIS — I10 Essential (primary) hypertension: Secondary | ICD-10-CM | POA: Diagnosis not present

## 2020-10-09 DIAGNOSIS — I2510 Atherosclerotic heart disease of native coronary artery without angina pectoris: Secondary | ICD-10-CM | POA: Insufficient documentation

## 2020-10-09 DIAGNOSIS — E782 Mixed hyperlipidemia: Secondary | ICD-10-CM

## 2020-10-09 DIAGNOSIS — R002 Palpitations: Secondary | ICD-10-CM

## 2020-10-09 NOTE — Progress Notes (Signed)
Cardiology Consultation:    Date:  10/09/2020   ID:  Melissa Gibson, DOB [birth year], MRN [id number]  PCP:  Melissa Craze, NP  Cardiologist:  Melissa Balsam, MD   Referring MD: Melissa Craze, NP   Chief Complaint  Patient presents with  . Establish Care    Fam hx/o CAD     History of Present Illness:    Melissa Gibson is a 55 y.o. female who is being seen today for the evaluation of I have multiple family members with premature coronary artery disease would like to be checked for it at the request of Melissa Craze, NP.  Past medical history significant for essential hypertension, dyslipidemia, she never smoked.  She had multiple family members having premature coronary artery disease and she would like to be checked make sure she is fine on top of that she did have a CT of her chest done as a screening for cancer CT revealed calcification of the circumflex artery and she is being care for evaluation of this problem.  Overall he does not have any chest pain tightness squeezing pressure burning chest.  For 18 years she has been walking on the regular basis during the lunchtime with her husband she has no difficulty doing it.  She gets mild shortness of breath but otherwise seems to be doing well.  She is complaining of having palpitations does happen about once a month she feels her heart speeding up abruptly and abruptly stopping there is no shortness of breath no chest pain associated with this sensation.  Last episode she had was on Sunday and that episode lasted for 60 minutes.  She never passed out because of that. She is not on any diet She walk on the regular basis with her husband however they continue talking while walking. Never smoked Does have family history of premature coronary artery disease  Past Medical History:  Diagnosis Date  . Abnormal Pap smear of cervix    -age 39/unsure if she had any treatment/03-03-15 LSIL:Pos HR HPV;LEEP 04-14-15 LGSIL  w/neg margins  . CIN II (cervical intraepithelial neoplasia II) 03/05/2016  . Constipation 10/07/2020  . Dysmenorrhea   . Hyperlipidemia   . HYPERLIPIDEMIA 10/08/2008   Qualifier: Diagnosis of  By: Yetta Barre MD, Bernadene Bell.   . Hyperparathyroidism (HCC) 10/22/2019  . Hypothyroid 10/05/10  . Hypothyroidism 10/08/2008   Qualifier: Diagnosis of  By: Yetta Barre MD, Bernadene Bell.   . Lateral epicondylitis  of elbow 12/27/2011  . Melasma 12/27/2011  . Menorrhagia   . Rectal bleeding 10/07/2020  . Right thyroid nodule 04/08/2020  . Secondary hyperparathyroidism, non-renal (HCC) 04/10/2020  . Thyroid nodule 05/19/2017    Past Surgical History:  Procedure Laterality Date  . COLONOSCOPY W/ BIOPSIES  07/10/2014   Diverticulosis of large intestines and hyperplastic ployp X 1 recheck in 10 yrs.  Marland Kitchen LEEP  04/14/15   CIN I  . NOVASURE ABLATION  11/2010  . TUBAL LIGATION  2006    Current Medications: Current Meds  Medication Sig  . atorvastatin (LIPITOR) 20 MG tablet Take 1 tablet (20 mg total) by mouth daily.  Marland Kitchen levothyroxine (SYNTHROID) 100 MCG tablet Take 1 tablet (100 mcg total) by mouth daily before breakfast.     Allergies:   Patient has no known allergies.   Social History   Socioeconomic History  . Marital status: Married    Spouse name: Not on file  . Number of children: Not on file  . Years of education: Not on  file  . Highest education level: Not on file  Occupational History  . Occupation: Airline pilot  Tobacco Use  . Smoking status: Former Smoker    Packs/day: 1.00    Years: 30.00    Pack years: 30.00    Types: Cigarettes    Quit date: 07/05/2008    Years since quitting: 12.2  . Smokeless tobacco: Never Used  . Tobacco comment: quit in 2010  Substance and Sexual Activity  . Alcohol use: Yes    Alcohol/week: 1.0 - 2.0 standard drink    Types: 1 - 2 Standard drinks or equivalent per week  . Drug use: No  . Sexual activity: Yes    Partners: Male    Birth control/protection: Surgical    Comment: BTL,  Ablation  Other Topics Concern  . Not on file  Social History Narrative   Regular Exercise -  NO   Former smoker- quit 5 years ago.   internet sales   Married   No children   1 year of college   Enjoys crafts, Presenter, broadcasting.   Social Determinants of Health   Financial Resource Strain: Not on file  Food Insecurity: Not on file  Transportation Needs: Not on file  Physical Activity: Not on file  Stress: Not on file  Social Connections: Not on file     Family History: The patient's family history includes Arthritis in her maternal grandfather; COPD in her brother and mother; Cancer in her maternal grandmother and sister; Cirrhosis in her brother; Heart attack in her father; Heart attack (age of onset: 50) in her brother; Heart failure in her father; Hyperlipidemia in her father and mother; Hypertension in her father; Infertility in her sister; Lung cancer (age of onset: 4) in her sister; Thyroid disease in her sister and sister. There is no history of Breast cancer or Hypercalcemia. ROS:   Please see the history of present illness.    All 14 point review of systems negative except as described per history of present illness.  EKGs/Labs/Other Studies Reviewed:    The following studies were reviewed today: EKG showed normal sinus rhythm, normal P interval, normal QS complex duration morphology no ST segment changes  EKG:  EKG is  ordered today.  The ekg ordered today demonstrates   Recent Labs: 04/08/2020: BUN 21; Creat 0.93; Potassium 4.4; Sodium 140; TSH 2.55 09/30/2020: Hemoglobin 15.4; Platelets 286.0  Recent Lipid Panel    Component Value Date/Time   CHOL 176 06/23/2020 0815   CHOL 227 (H) 04/02/2019 0926   TRIG 121.0 06/23/2020 0815   HDL 55.50 06/23/2020 0815   HDL 46 04/02/2019 0926   CHOLHDL 3 06/23/2020 0815   VLDL 24.2 06/23/2020 0815   LDLCALC 96 06/23/2020 0815   LDLCALC 171 (H) 08/24/2019 1557   LDLDIRECT 125.0 01/29/2010 0906    Physical Exam:    VS:  BP  140/84 (BP Location: Right Arm, Patient Position: Sitting)   Pulse 78   Ht 5' 4.5" (1.638 m)   Wt 181 lb (82.1 kg)   SpO2 96%   BMI 30.59 kg/m     Wt Readings from Last 3 Encounters:  10/09/20 181 lb (82.1 kg)  09/30/20 184 lb (83.5 kg)  06/23/20 189 lb (85.7 kg)     GEN:  Well nourished, well developed in no acute distress HEENT: Normal NECK: No JVD; No carotid bruits LYMPHATICS: No lymphadenopathy CARDIAC: RRR, no murmurs, no rubs, no gallops RESPIRATORY:  Clear to auscultation without rales, wheezing or rhonchi  ABDOMEN:  Soft, non-tender, non-distended MUSCULOSKELETAL:  No edema; No deformity  SKIN: Warm and dry NEUROLOGIC:  Alert and oriented x 3 PSYCHIATRIC:  Normal affect   ASSESSMENT:    1. Mixed hyperlipidemia   2. Coronary artery disease involving native coronary artery of native heart without angina pectoris   3. Essential hypertension   4. Palpitations    PLAN:    In order of problems listed above:  1. Palpitations: I will ask her to wear Zio patch for a week I did talk to her also about getting an apple watch potentially for recording palpitations.  As a part of work-up I will ask her to have an echocardiogram make sure that structurally her heart is normal.  However, physical examination has been unrevealing. 2. Multiple risk factors for having coronary artery disease.  On top of that she does have calcification of the circumflex artery already, however, she is completely asymptomatic.  She exercised on the regular basis without any problems with acute point [place] is to modify her risk factors for coronary artery disease. 3. Essential hypertension asked her to start taking amlodipine that she was given already.  We need to get her blood pressure to target level of less than 130/80. 4. Dyslipidemia she is taking statin her cholesterol seems to be reasonable. 5. We did talk about exercises on the regular basis I did discuss basic of Mediterranean diet with her.   And she is willing to try.   Medication Adjustments/Labs and Tests Ordered: Current medicines are reviewed at length with the patient today.  Concerns regarding medicines are outlined above.  No orders of the defined types were placed in this encounter.  No orders of the defined types were placed in this encounter.   Signed, Georgeanna Lea, MD, [facility]. 10/09/2020 3:19 PM    [facility]

## 2020-10-09 NOTE — Patient Instructions (Signed)
Medication Instructions:  Your physician recommends that you continue on your current medications as directed. Please refer to the Current Medication list given to you today.  *If you need a refill on your cardiac medications before your next appointment, please call your pharmacy*   Lab Work: None If you have labs (blood work) drawn today and your tests are completely normal, you will receive your results only by: . MyChart Message (if you have MyChart) OR . A paper copy in the mail If you have any lab test that is abnormal or we need to change your treatment, we will call you to review the results.   Testing/Procedures: A zio monitor was ordered today. It will remain on for 7  days. You will then return monitor and event diary in provided box. It takes 1-2 weeks for report to be downloaded and returned to us. We will call you with the results. If monitor falls off or has orange flashing light, please call Zio for further instructions.   Your physician has requested that you have an echocardiogram. Echocardiography is a painless test that uses sound waves to create images of your heart. It provides your doctor with information about the size and shape of your heart and how well your heart's chambers and valves are working. This procedure takes approximately one hour. There are no restrictions for this procedure.     Follow-Up: At [facility], you and your health needs are our priority.  As part of our continuing mission to provide you with exceptional heart care, we have created designated Provider Care Teams.  These Care Teams include your primary Cardiologist (physician) and Advanced Practice Providers (APPs -  Physician Assistants and Nurse Practitioners) who all work together to provide you with the care you need, when you need it.  We recommend signing up for the patient portal called "MyChart".  Sign up information is provided on this After Visit Summary.  MyChart is used to  connect with patients for Virtual Visits (Telemedicine).  Patients are able to view lab/test results, encounter notes, upcoming appointments, etc.  Non-urgent messages can be sent to your provider as well.   To learn more about what you can do with MyChart, go to [email or web address].[email or web address].    Your next appointment:   3 month(s)  The format for your next appointment:   In Person  Provider:   Robert Krasowski, MD   Other Instructions   Echocardiogram An echocardiogram is a test that uses sound waves (ultrasound) to produce images of the heart. Images from an echocardiogram can provide important information about:  Heart size and shape.  The size and thickness and movement of your heart's walls.  Heart muscle function and strength.  Heart valve function or if you have stenosis. Stenosis is when the heart valves are too narrow.  If blood is flowing backward through the heart valves (regurgitation).  A tumor or infectious growth around the heart valves.  Areas of heart muscle that are not working well because of poor blood flow or injury from a heart attack.  Aneurysm detection. An aneurysm is a weak or damaged part of an artery wall. The wall bulges out from the normal force of blood pumping through the body. Tell a health care provider about:  Any allergies you have.  All medicines you are taking, including vitamins, herbs, eye drops, creams, and over-the-counter medicines.  Any blood disorders you have.  Any surgeries you have had.  Any medical conditions you have.  Whether   you are pregnant or may be pregnant. What are the risks? Generally, this is a safe test. However, problems may occur, including an allergic reaction to dye (contrast) that may be used during the test. What happens before the test? No specific preparation is needed. You may eat and drink normally. What happens during the test?  You will take off your clothes from the waist up and put on a hospital  gown.  Electrodes or electrocardiogram (ECG)patches may be placed on your chest. The electrodes or patches are then connected to a device that monitors your heart rate and rhythm.  You will lie down on a table for an ultrasound exam. A gel will be applied to your chest to help sound waves pass through your skin.  A handheld device, called a transducer, will be pressed against your chest and moved over your heart. The transducer produces sound waves that travel to your heart and bounce back (or "echo" back) to the transducer. These sound waves will be captured in real-time and changed into images of your heart that can be viewed on a video monitor. The images will be recorded on a computer and reviewed by your health care provider.  You may be asked to change positions or hold your breath for a short time. This makes it easier to get different views or better views of your heart.  In some cases, you may receive contrast through an IV in one of your veins. This can improve the quality of the pictures from your heart. The procedure may vary among health care providers and hospitals.   What can I expect after the test? You may return to your normal, everyday life, including diet, activities, and medicines, unless your health care provider tells you not to do that. Follow these instructions at home:  It is up to you to get the results of your test. Ask your health care provider, or the department that is doing the test, when your results will be ready.  Keep all follow-up visits. This is important. Summary  An echocardiogram is a test that uses sound waves (ultrasound) to produce images of the heart.  Images from an echocardiogram can provide important information about the size and shape of your heart, heart muscle function, heart valve function, and other possible heart problems.  You do not need to do anything to prepare before this test. You may eat and drink normally.  After the  echocardiogram is completed, you may return to your normal, everyday life, unless your health care provider tells you not to do that. This information is not intended to replace advice given to you by your health care provider. Make sure you discuss any questions you have with your health care provider. Document Revised: 02/12/2020 Document Reviewed: 02/12/2020 Elsevier Patient Education  2021 Elsevier Inc.   

## 2020-10-16 DIAGNOSIS — I2510 Atherosclerotic heart disease of native coronary artery without angina pectoris: Secondary | ICD-10-CM

## 2020-10-16 DIAGNOSIS — E782 Mixed hyperlipidemia: Secondary | ICD-10-CM

## 2020-10-16 DIAGNOSIS — R002 Palpitations: Secondary | ICD-10-CM | POA: Diagnosis not present

## 2020-10-16 DIAGNOSIS — I10 Essential (primary) hypertension: Secondary | ICD-10-CM | POA: Diagnosis not present

## 2020-10-20 DIAGNOSIS — I10 Essential (primary) hypertension: Secondary | ICD-10-CM | POA: Diagnosis not present

## 2020-10-20 DIAGNOSIS — I2510 Atherosclerotic heart disease of native coronary artery without angina pectoris: Secondary | ICD-10-CM | POA: Diagnosis not present

## 2020-10-20 DIAGNOSIS — R002 Palpitations: Secondary | ICD-10-CM | POA: Diagnosis not present

## 2020-10-27 ENCOUNTER — Other Ambulatory Visit: Payer: Self-pay | Admitting: Family

## 2020-10-31 ENCOUNTER — Ambulatory Visit: Payer: BC Managed Care – PPO | Admitting: Family

## 2020-11-07 ENCOUNTER — Ambulatory Visit (HOSPITAL_BASED_OUTPATIENT_CLINIC_OR_DEPARTMENT_OTHER)
Admission: RE | Admit: 2020-11-07 | Discharge: 2020-11-07 | Disposition: A | Discharge status: Home / Self Care | Payer: BC Managed Care – PPO | Source: Ambulatory Visit | Attending: Cardiology | Admitting: Cardiology

## 2020-11-07 ENCOUNTER — Other Ambulatory Visit: Payer: Self-pay

## 2020-11-07 DIAGNOSIS — I10 Essential (primary) hypertension: Secondary | ICD-10-CM

## 2020-11-07 DIAGNOSIS — I2510 Atherosclerotic heart disease of native coronary artery without angina pectoris: Secondary | ICD-10-CM | POA: Insufficient documentation

## 2020-11-07 DIAGNOSIS — R002 Palpitations: Secondary | ICD-10-CM | POA: Diagnosis not present

## 2020-11-07 DIAGNOSIS — E782 Mixed hyperlipidemia: Secondary | ICD-10-CM | POA: Insufficient documentation

## 2020-11-10 LAB — ECHOCARDIOGRAM COMPLETE
AR max vel: 2.44 cm2
AV Area VTI: 2.4 cm2
AV Area mean vel: 2.4 cm2
AV Mean grad: 3 mmHg
AV Peak grad: 7.7 mmHg
Ao pk vel: 1.39 m/s
Area-P 1/2: 3.08 cm2
Calc EF: 70.2 %
S' Lateral: 2.72 cm
Single Plane A2C EF: 69.2 %
Single Plane A4C EF: 72.2 %

## 2020-11-14 ENCOUNTER — Other Ambulatory Visit (HOSPITAL_BASED_OUTPATIENT_CLINIC_OR_DEPARTMENT_OTHER): Payer: BC Managed Care – PPO

## 2020-11-14 ENCOUNTER — Ambulatory Visit: Payer: BC Managed Care – PPO | Admitting: Family

## 2020-11-21 ENCOUNTER — Other Ambulatory Visit: Payer: Self-pay

## 2020-11-21 ENCOUNTER — Ambulatory Visit: Payer: BC Managed Care – PPO | Admitting: Family

## 2020-11-21 DIAGNOSIS — I10 Essential (primary) hypertension: Secondary | ICD-10-CM

## 2020-11-21 MED ORDER — AMLODIPINE BESYLATE 2.5 MG PO TABS: 2.5000 mg | ORAL_TABLET | Freq: Every day | ORAL | 1 refills | Status: DC

## 2020-11-21 NOTE — Patient Instructions (Signed)
-   Continue amlodipine ?

## 2020-11-21 NOTE — Assessment & Plan Note (Signed)
BP is improved, headache is resolved. Continue amlodipine 2.5mg  once daily.

## 2020-11-21 NOTE — Progress Notes (Signed)
Subjective:   By signing my name below, I, Shehryar Baig, attest that this documentation has been prepared under the direction and in the presence of Sandford Craze NP. 11/21/2020      Patient ID: Melissa Gibson, female    DOB: 1965-12-19, 55 y.o.   MRN: [id number]  No chief complaint on file.   HPI Patient is in today for a office visit. She is doing well at this time.   Blood pressure- During her last visit her blood pressure was elevated and she was complaining of headaches. Since then she was given 5 mg amlodipine daily PO to manage her symptoms well. Her headaches have since resolved. Skin- Her petechiae has resolved since her last visit.   Past Medical History:  Diagnosis Date  . Abnormal Pap smear of cervix    -age 48/unsure if she had any treatment/03-03-15 LSIL:Pos HR HPV;LEEP 04-14-15 LGSIL w/neg margins  . CIN II (cervical intraepithelial neoplasia II) 03/05/2016  . Constipation 10/07/2020  . Dysmenorrhea   . Hyperlipidemia   . HYPERLIPIDEMIA 10/08/2008   Qualifier: Diagnosis of  By: Yetta Barre MD, Bernadene Bell.   . Hyperparathyroidism (HCC) 10/22/2019  . Hypothyroid 10/05/10  . Hypothyroidism 10/08/2008   Qualifier: Diagnosis of  By: Yetta Barre MD, Bernadene Bell.   . Lateral epicondylitis  of elbow 12/27/2011  . Melasma 12/27/2011  . Menorrhagia   . Rectal bleeding 10/07/2020  . Right thyroid nodule 04/08/2020  . Secondary hyperparathyroidism, non-renal (HCC) 04/10/2020  . Thyroid nodule 05/19/2017    Past Surgical History:  Procedure Laterality Date  . COLONOSCOPY W/ BIOPSIES  07/10/2014   Diverticulosis of large intestines and hyperplastic ployp X 1 recheck in 10 yrs.  Marland Kitchen LEEP  04/14/15   CIN I  . NOVASURE ABLATION  11/2010  . TUBAL LIGATION  2006    Family History  Problem Relation Age of Onset  . Heart attack Father   . Hypertension Father   . Heart failure Father   . Hyperlipidemia Father   . Lung cancer Sister 77       lung cancer  . Infertility Sister   . Thyroid  disease Sister   . Cancer Sister        lung; died at 67yr  . COPD Mother        on hospice  . Hyperlipidemia Mother   . Heart attack Brother 52  . COPD Brother   . Cirrhosis Brother        caused his death, ETOH abuse  . Thyroid disease Sister        hypothyroid  . Cancer Maternal Grandmother        lymph nodes  . Arthritis Maternal Grandfather   . Breast cancer Neg Hx   . Hypercalcemia Neg Hx     Social History   Socioeconomic History  . Marital status: Married    Spouse name: Not on file  . Number of children: Not on file  . Years of education: Not on file  . Highest education level: Not on file  Occupational History  . Occupation: Airline pilot  Tobacco Use  . Smoking status: Former Smoker    Packs/day: 1.00    Years: 30.00    Pack years: 30.00    Types: Cigarettes    Quit date: 07/05/2008    Years since quitting: 12.3  . Smokeless tobacco: Never Used  . Tobacco comment: quit in 2010  Substance and Sexual Activity  . Alcohol use: Yes    Alcohol/week: 1.0 -  2.0 standard drink    Types: 1 - 2 Standard drinks or equivalent per week  . Drug use: No  . Sexual activity: Yes    Partners: Male    Birth control/protection: Surgical    Comment: BTL, Ablation  Other Topics Concern  . Not on file  Social History Narrative   Regular Exercise -  NO   Former smoker- quit 5 years ago.   internet sales   Married   No children   1 year of college   Enjoys crafts, Presenter, broadcasting.   Social Determinants of Health   Financial Resource Strain: Not on file  Food Insecurity: Not on file  Transportation Needs: Not on file  Physical Activity: Not on file  Stress: Not on file  Social Connections: Not on file  Intimate Partner Violence: Not on file    Outpatient Medications Prior to Visit  Medication Sig Dispense Refill  . amLODipine (NORVASC) 2.5 MG tablet Take 1 tablet (2.5 mg total) by mouth daily. 30 tablet 3  . atorvastatin (LIPITOR) 20 MG tablet Take 1 tablet (20 mg total) by  mouth daily. 90 tablet 1  . levothyroxine (SYNTHROID) 100 MCG tablet Take 1 tablet (100 mcg total) by mouth daily before breakfast. 90 tablet 3   No facility-administered medications prior to visit.    No Known Allergies  Review of Systems  Skin:       (-)Petechiae   Neurological: Negative for headaches.       Objective:    Physical Exam Constitutional:      Appearance: Normal appearance.  HENT:     Head: Normocephalic and atraumatic.     Right Ear: External ear normal.     Left Ear: External ear normal.  Eyes:     Extraocular Movements: Extraocular movements intact.     Pupils: Pupils are equal, round, and reactive to light.  Cardiovascular:     Rate and Rhythm: Normal rate and regular rhythm.     Pulses: Normal pulses.     Heart sounds: Normal heart sounds. No murmur heard. No gallop.   Pulmonary:     Effort: Pulmonary effort is normal. No respiratory distress.     Breath sounds: Normal breath sounds. No wheezing, rhonchi or rales.  Lymphadenopathy:     Cervical: No cervical adenopathy.  Skin:    General: Skin is warm and dry.  Neurological:     Mental Status: She is alert and oriented to person, place, and time.  Psychiatric:        Behavior: Behavior normal.     There were no vitals taken for this visit. Wt Readings from Last 3 Encounters:  10/09/20 181 lb (82.1 kg)  09/30/20 184 lb (83.5 kg)  06/23/20 189 lb (85.7 kg)    Diabetic Foot Exam - Simple   No data filed    Lab Results  Component Value Date   WBC 7.0 09/30/2020   HGB 15.4 (H) 09/30/2020   HCT 45.7 09/30/2020   PLT 286.0 09/30/2020   GLUCOSE 90 04/08/2020   CHOL 176 06/23/2020   TRIG 121.0 06/23/2020   HDL 55.50 06/23/2020   LDLDIRECT 125.0 01/29/2010   LDLCALC 96 06/23/2020   ALT 22 07/27/2019   AST 19 07/27/2019   NA 140 04/08/2020   K 4.4 04/08/2020   CL 103 04/08/2020   CREATININE 0.93 04/08/2020   BUN 21 04/08/2020   CO2 27 04/08/2020   TSH 2.55 04/08/2020    Lab  Results  Component Value  Date   TSH 2.55 04/08/2020   Lab Results  Component Value Date   WBC 7.0 09/30/2020   HGB 15.4 (H) 09/30/2020   HCT 45.7 09/30/2020   MCV 88.0 09/30/2020   PLT 286.0 09/30/2020   Lab Results  Component Value Date   NA 140 04/08/2020   K 4.4 04/08/2020   CO2 27 04/08/2020   GLUCOSE 90 04/08/2020   BUN 21 04/08/2020   CREATININE 0.93 04/08/2020   BILITOT 0.5 07/27/2019   ALKPHOS 100 04/02/2019   AST 19 07/27/2019   ALT 22 07/27/2019   PROT 7.6 07/27/2019   ALBUMIN 4.1 04/02/2019   CALCIUM 9.6 04/08/2020   Lab Results  Component Value Date   CHOL 176 06/23/2020   Lab Results  Component Value Date   HDL 55.50 06/23/2020   Lab Results  Component Value Date   LDLCALC 96 06/23/2020   Lab Results  Component Value Date   TRIG 121.0 06/23/2020   Lab Results  Component Value Date   CHOLHDL 3 06/23/2020   No results found for: HGBA1C     Assessment & Plan:   Problem List Items Addressed This Visit   None      No orders of the defined types were placed in this encounter.   I, Sandford Craze NP, personally preformed the services described in this documentation.  All medical record entries made by the scribe were at my direction and in my presence.  I have reviewed the chart and discharge instructions (if applicable) and agree that the record reflects my personal performance and is accurate and complete. 11/21/2020   I,Shehryar Baig,acting as a Neurosurgeon for Lemont Fillers, NP.,have documented all relevant documentation on the behalf of Melissa AMARA, NP,as directed by  Lemont Fillers, NP while in the presence of Lemont Fillers, NP.   Shehryar [employer]

## 2021-01-12 DIAGNOSIS — U071 COVID-19: Secondary | ICD-10-CM

## 2021-01-12 HISTORY — DX: COVID-19: U07.1

## 2021-01-22 ENCOUNTER — Encounter: Payer: Self-pay | Admitting: Cardiology

## 2021-01-22 ENCOUNTER — Telehealth (INDEPENDENT_AMBULATORY_CARE_PROVIDER_SITE_OTHER): Payer: BC Managed Care – PPO | Admitting: Cardiology

## 2021-01-22 VITALS — BP 127/74 | Ht 64.5 in | Wt 177.0 lb

## 2021-01-22 DIAGNOSIS — I2510 Atherosclerotic heart disease of native coronary artery without angina pectoris: Secondary | ICD-10-CM | POA: Diagnosis not present

## 2021-01-22 DIAGNOSIS — E782 Mixed hyperlipidemia: Secondary | ICD-10-CM | POA: Diagnosis not present

## 2021-01-22 DIAGNOSIS — I10 Essential (primary) hypertension: Secondary | ICD-10-CM

## 2021-01-22 NOTE — Progress Notes (Signed)
Evaluation Performed:  Follow-up visit  This visit type was conducted due to national recommendations for restrictions regarding the COVID-19 Pandemic (e.g. social distancing).  This format is felt to be most appropriate for this patient at this time.  All issues noted in this document were discussed and addressed.  No physical exam was performed (except for noted visual exam findings with Video Visits).  Please refer to the patient's chart (MyChart message for video visits and phone note for telephone visits) for the patient's consent to telehealth for [facility].  Date:  01/22/2021  ID: Melissa Gibson, DOB 04/09/1966, MRN [id number]   Patient Location: [address] [facility] [place] [address]   Provider location:   [facility] [place] [facility]  PCP:  Sandford Craze, NP  Cardiologist:  Gypsy Balsam, MD     Chief Complaint: I have COVID  History of Present Illness:    Melissa Gibson is a 55 y.o. female  who presents via audio/video conferencing for a telehealth visit today.  Who was referred to [place] because of episode of palpitations, multiple risk factors for coronary artery disease and already present calcification of the circumflex artery.  She also got essential hypertension as well as dyslipidemia.  She is supposed to have a regular visit today however tested positive for COVID that is why we have a virtual visit with doing video visit.  She is doing well in spite of fact she got COVID she said only mild course and she start getting better.  Denies have any chest pain tightness squeezing pressure burning chest, she lost about 4 pounds since I seen her last time.  She jokingly complained about her husband who is an excellent cook and he is in town and that is what make her lose weight so difficult.   The patient does not have symptoms concerning for COVID-19 infection (fever, chills, cough, or new SHORTNESS OF BREATH).    Prior CV studies:   The following  studies were reviewed today:  Echocardiogram showing normal left ventricle ejection fraction, trace aortic insufficiency.     Past Medical History:  Diagnosis Date   Abnormal Pap smear of cervix    -age 30/unsure if she had any treatment/03-03-15 LSIL:Pos HR HPV;LEEP 04-14-15 LGSIL w/neg margins   CIN II (cervical intraepithelial neoplasia II) 03/05/2016   Constipation 10/07/2020   COVID 01/12/2021   Dysmenorrhea    Hyperlipidemia    HYPERLIPIDEMIA 10/08/2008   Qualifier: Diagnosis of  By: Yetta Barre MD, Bernadene Bell.    Hyperparathyroidism (HCC) 10/22/2019   Hypothyroid 10/05/2010   Hypothyroidism 10/08/2008   Qualifier: Diagnosis of  By: Yetta Barre MD, Bernadene Bell.    Lateral epicondylitis  of elbow 12/27/2011   Melasma 12/27/2011   Menorrhagia    Rectal bleeding 10/07/2020   Right thyroid nodule 04/08/2020   Secondary hyperparathyroidism, non-renal (HCC) 04/10/2020   Thyroid nodule 05/19/2017    Past Surgical History:  Procedure Laterality Date   COLONOSCOPY W/ BIOPSIES  07/10/2014   Diverticulosis of large intestines and hyperplastic ployp X 1 recheck in 10 yrs.   LEEP  04/14/15   CIN I   NOVASURE ABLATION  11/2010   TUBAL LIGATION  2006     Current Meds  Medication Sig   amLODipine (NORVASC) 2.5 MG tablet Take 1 tablet (2.5 mg total) by mouth daily.   atorvastatin (LIPITOR) 20 MG tablet Take 1 tablet (20 mg total) by mouth daily.   levothyroxine (SYNTHROID) 100 MCG tablet Take 1 tablet (100 mcg  total) by mouth daily before breakfast.      Family History: The patient's family history includes Arthritis in her maternal grandfather; COPD in her brother and mother; Cancer in her maternal grandmother and sister; Cirrhosis in her brother; Heart attack in her father; Heart attack (age of onset: 70) in her brother; Heart failure in her father; Hyperlipidemia in her father and mother; Hypertension in her father; Infertility in her sister; Lung cancer (age of onset: 59) in her sister;  Thyroid disease in her sister and sister. There is no history of Breast cancer or Hypercalcemia.   ROS:   Please see the history of present illness.     All other systems reviewed and are negative.   Labs/Other Tests and Data Reviewed:     Recent Labs: 04/08/2020: BUN 21; Creat 0.93; Potassium 4.4; Sodium 140; TSH 2.55 09/30/2020: Hemoglobin 15.4; Platelets 286.0  Recent Lipid Panel    Component Value Date/Time   CHOL 176 06/23/2020 0815   CHOL 227 (H) 04/02/2019 0926   TRIG 121.0 06/23/2020 0815   HDL 55.50 06/23/2020 0815   HDL 46 04/02/2019 0926   CHOLHDL 3 06/23/2020 0815   VLDL 24.2 06/23/2020 0815   LDLCALC 96 06/23/2020 0815   LDLCALC 171 (H) 08/24/2019 1557   LDLDIRECT 125.0 01/29/2010 0906      Exam:    Vital Signs:  BP 127/74   Ht 5' 4.5" (1.638 m)   Wt 177 lb (80.3 kg)   BMI 29.91 kg/m     Wt Readings from Last 3 Encounters:  01/22/21 177 lb (80.3 kg)  11/21/20 182 lb 3.2 oz (82.6 kg)  10/09/20 181 lb (82.1 kg)     Well nourished, well developed in no acute distress. Alert awake and attentive in spite of how she does have COVID she looks good.  Denies have any problems during the visit  Diagnosis for this visit:   1. Coronary artery disease involving native coronary artery of native heart without angina pectoris   2. Essential hypertension   3. Mixed hyperlipidemia      ASSESSMENT & PLAN:    1.  Coronary artery disease in form of calcification of the coronary arteries circumflex artery however she is completely asymptomatic we did review need to exercise on the regular basis as well as need to stick with good diet. 2.  Dyslipidemia: I will ask him to have fasting lipid profile done in about a month.  In the meantime we will continue with statin. 3.  Palpitations I did review monitor with her which only 1 episode of supraventricular tachycardia overall no need to treat and she is doing well from that aspect. 4.  Essential hypertension  controlled  COVID-19 Education: The signs and symptoms of COVID-19 were discussed with the patient and how to seek care for testing (follow up with PCP or arrange E-visit).  The importance of social distancing was discussed today.  Patient Risk:   After full review of this patients clinical status, I feel that they are at least moderate risk at this time.  Time:   Today, I have spent 15 minutes with the patient with telehealth technology discussing pt health issues.  I spent 15 minutes reviewing her chart before the visit.  Visit was finished at 2:46 PM.    Medication Adjustments/Labs and Tests Ordered: Current medicines are reviewed at length with the patient today.  Concerns regarding medicines are outlined above.  No orders of the defined types were placed in this encounter.  Medication changes: No orders of the defined types were placed in this encounter.    Disposition: Follow-up 1 year  Signed, Georgeanna Lea, MD, [facility] 01/22/2021 2:44 PM    [facility]

## 2021-01-22 NOTE — Patient Instructions (Addendum)
Medication Instructions:  Your physician recommends that you continue on your current medications as directed. Please refer to the Current Medication list given to you today.  *If you need a refill on your cardiac medications before your next appointment, please call your pharmacy*   Lab Work: Your physician recommends that you return for lab work in:  Later: Lipids - come fasting If you have labs (blood work) drawn today and your tests are completely normal, you will receive your results only by: MyChart Message (if you have MyChart) OR A paper copy in the mail If you have any lab test that is abnormal or we need to change your treatment, we will call you to review the results.   Testing/Procedures: None   Follow-Up: At [facility], you and your health needs are our priority.  As part of our continuing mission to provide you with exceptional heart care, we have created designated Provider Care Teams.  These Care Teams include your primary Cardiologist (physician) and Advanced Practice Providers (APPs -  Physician Assistants and Nurse Practitioners) who all work together to provide you with the care you need, when you need it.  We recommend signing up for the patient portal called "MyChart".  Sign up information is provided on this After Visit Summary.  MyChart is used to connect with patients for Virtual Visits (Telemedicine).  Patients are able to view lab/test results, encounter notes, upcoming appointments, etc.  Non-urgent messages can be sent to your provider as well.   To learn more about what you can do with MyChart, go to [email or web address].    Your next appointment:   1 year(s)  The format for your next appointment:   In Person  Provider:   Gypsy Balsam, MD   Other Instructions

## 2021-02-24 ENCOUNTER — Other Ambulatory Visit: Payer: Self-pay

## 2021-02-24 ENCOUNTER — Ambulatory Visit
Admission: RE | Admit: 2021-02-24 | Discharge: 2021-02-24 | Disposition: A | Discharge status: Home / Self Care | Payer: BC Managed Care – PPO | Source: Ambulatory Visit | Attending: Nurse Practitioner | Admitting: Nurse Practitioner

## 2021-02-24 ENCOUNTER — Other Ambulatory Visit: Payer: Self-pay | Admitting: Nurse Practitioner

## 2021-02-24 DIAGNOSIS — M79642 Pain in left hand: Secondary | ICD-10-CM | POA: Diagnosis not present

## 2021-02-24 DIAGNOSIS — M79645 Pain in left finger(s): Secondary | ICD-10-CM

## 2021-02-24 DIAGNOSIS — S6992XA Unspecified injury of left wrist, hand and finger(s), initial encounter: Secondary | ICD-10-CM

## 2021-04-10 ENCOUNTER — Ambulatory Visit: Payer: BC Managed Care – PPO | Admitting: Family

## 2021-04-24 ENCOUNTER — Encounter: Payer: Self-pay | Admitting: Family

## 2021-04-24 ENCOUNTER — Ambulatory Visit: Payer: BC Managed Care – PPO | Admitting: Family

## 2021-04-24 ENCOUNTER — Other Ambulatory Visit: Payer: Self-pay

## 2021-04-24 VITALS — BP 136/87 | HR 65 | Temp 97.8°F | Resp 16 | Ht 64.5 in | Wt 176.0 lb

## 2021-04-24 DIAGNOSIS — E039 Hypothyroidism, unspecified: Secondary | ICD-10-CM

## 2021-04-24 DIAGNOSIS — E211 Secondary hyperparathyroidism, not elsewhere classified: Secondary | ICD-10-CM

## 2021-04-24 DIAGNOSIS — I10 Essential (primary) hypertension: Secondary | ICD-10-CM | POA: Diagnosis not present

## 2021-04-24 DIAGNOSIS — Z0000 Encounter for general adult medical examination without abnormal findings: Secondary | ICD-10-CM

## 2021-04-24 DIAGNOSIS — E038 Other specified hypothyroidism: Secondary | ICD-10-CM | POA: Diagnosis not present

## 2021-04-24 DIAGNOSIS — E782 Mixed hyperlipidemia: Secondary | ICD-10-CM | POA: Diagnosis not present

## 2021-04-24 MED ORDER — ATORVASTATIN CALCIUM 20 MG PO TABS: 20.0000 mg | ORAL_TABLET | Freq: Every day | ORAL | 1 refills | Status: DC

## 2021-04-24 MED ORDER — AMLODIPINE BESYLATE 2.5 MG PO TABS: 2.5000 mg | ORAL_TABLET | Freq: Every day | ORAL | 1 refills | Status: DC

## 2021-04-24 NOTE — Addendum Note (Signed)
Addended by: Thelma Barge D on: 04/24/2021 02:37 PM   Modules accepted: Orders

## 2021-04-24 NOTE — Progress Notes (Signed)
Subjective:   By signing my name below, I, [name], attest that this documentation has been prepared under the direction and in the presence of Domini, Vandehei, NP 04/24/2021    Patient ID: Melissa Gibson, female    DOB: 1965/11/01, 55 y.o.   MRN: [id number]  Chief Complaint  Patient presents with   Hypothyroidism    Here for follow up   Hypertension    Here for follow up   Hyperlipidemia    HPI Patient is in today for an office visit.  Hypothyroidism- She manages her thyroid levels with 100 mcg synthroid and is doing well on it. She will have it rechecked today.  Hypertension- She manages her blood pressure with 2.5 mg amlodipine and is doing well on it. She would like a refill. BP Readings from Last 3 Encounters:  04/24/21 136/87  01/22/21 127/74  11/21/20 (!) 127/54    Hyperlipidemia- She manages her cholesterol levels with 20 mg Lipitor PO daily. She would like a refill.  Immunizations- She has 3 Pfizer Covid-19 vaccines at this time. She will be receiving the flu vaccine next week at work.  Preventative Care- She is UTD on pap smear and colonoscopy. She is going to schedule a mammogram.   Past Medical History:  Diagnosis Date   Abnormal Pap smear of cervix    -age 51/unsure if she had any treatment/03-03-15 LSIL:Pos HR HPV;LEEP 04-14-15 LGSIL w/neg margins   CIN II (cervical intraepithelial neoplasia II) 03/05/2016   Constipation 10/07/2020   COVID 01/12/2021   Dysmenorrhea    Hyperlipidemia    HYPERLIPIDEMIA 10/08/2008   Qualifier: Diagnosis of  By: Ronnald Ramp MD, Arvid Right.    Hyperparathyroidism ([place]) 10/22/2019   Hypothyroid 10/05/2010   Hypothyroidism 10/08/2008   Qualifier: Diagnosis of  By: Ronnald Ramp MD, Arvid Right.    Lateral epicondylitis  of elbow 12/27/2011   Lateral epicondylitis  of elbow 12/27/2011   Melasma 12/27/2011   Menorrhagia    Rectal bleeding 10/07/2020   Right thyroid nodule 04/08/2020   Secondary hyperparathyroidism, non-renal  ([place]) 04/10/2020   Thyroid nodule 05/19/2017    Past Surgical History:  Procedure Laterality Date   COLONOSCOPY W/ BIOPSIES  07/10/2014   Diverticulosis of large intestines and hyperplastic ployp X 1 recheck in 10 yrs.   LEEP  04/14/15   CIN I   NOVASURE ABLATION  11/2010   TUBAL LIGATION  2006    Family History  Problem Relation Age of Onset   Heart attack Father    Hypertension Father    Heart failure Father    Hyperlipidemia Father    Lung cancer Sister 64       lung cancer   Infertility Sister    Thyroid disease Sister    Cancer Sister        lung; died at 78yr  COPD Mother        on hospice   Hyperlipidemia Mother    Heart attack Brother 547  COPD Brother    Cirrhosis Brother        caused his death, ETOH abuse   Thyroid disease Sister        hypothyroid   Cancer Maternal Grandmother        lymph nodes   Arthritis Maternal Grandfather    Breast cancer Neg Hx    Hypercalcemia Neg Hx     Social History   Socioeconomic History   Marital status: Married    Spouse name: Not on file  Number of children: Not on file   Years of education: Not on file   Highest education level: Not on file  Occupational History   Occupation: SALES  Tobacco Use   Smoking status: Former    Packs/day: 1.00    Years: 30.00    Pack years: 30.00    Types: Cigarettes    Quit date: 07/05/2008    Years since quitting: 12.8   Smokeless tobacco: Never   Tobacco comments:    quit in 2010  Substance and Sexual Activity   Alcohol use: Yes    Alcohol/week: 1.0 - 2.0 standard drink    Types: 1 - 2 Standard drinks or equivalent per week   Drug use: No   Sexual activity: Yes    Partners: Male    Birth control/protection: Surgical    Comment: BTL, Ablation  Other Topics Concern   Not on file  Social History Narrative   Regular Exercise -  NO   Former smoker- quit 5 years ago.   internet sales   Married   No children   1 year of college   Enjoys crafts, Haematologist.   Social  Determinants of Health   Financial Resource Strain: Not on file  Food Insecurity: Not on file  Transportation Needs: Not on file  Physical Activity: Not on file  Stress: Not on file  Social Connections: Not on file  Intimate Partner Violence: Not on file    Outpatient Medications Prior to Visit  Medication Sig Dispense Refill   levothyroxine (SYNTHROID) 100 MCG tablet Take 1 tablet (100 mcg total) by mouth daily before breakfast. 90 tablet 3   amLODipine (NORVASC) 2.5 MG tablet Take 1 tablet (2.5 mg total) by mouth daily. 90 tablet 1   atorvastatin (LIPITOR) 20 MG tablet Take 1 tablet (20 mg total) by mouth daily. 90 tablet 1   No facility-administered medications prior to visit.    No Known Allergies  Review of Systems  Constitutional:  Negative for fever.  HENT:  Negative for ear pain and hearing loss.        (-)nystagmus (-)adenopathy  Eyes:  Negative for blurred vision.  Respiratory:  Negative for cough, shortness of breath and wheezing.   Cardiovascular:  Negative for chest pain and leg swelling.  Gastrointestinal:  Negative for blood in stool, diarrhea, nausea and vomiting.  Genitourinary:  Negative for dysuria and frequency.  Musculoskeletal:  Negative for joint pain and myalgias.  Skin:  Negative for rash.  Neurological:  Negative for headaches.  Psychiatric/Behavioral:  Negative for depression. The patient is not nervous/anxious.       Objective:    Physical Exam Constitutional:      General: She is not in acute distress.    Appearance: Normal appearance. She is not ill-appearing.  HENT:     Head: Normocephalic and atraumatic.     Right Ear: External ear normal.     Left Ear: External ear normal.  Eyes:     Extraocular Movements: Extraocular movements intact.     Pupils: Pupils are equal, round, and reactive to light.  Cardiovascular:     Rate and Rhythm: Normal rate and regular rhythm.     Pulses: Normal pulses.     Heart sounds: Normal heart sounds. No  murmur heard. Pulmonary:     Effort: Pulmonary effort is normal. No respiratory distress.     Breath sounds: Normal breath sounds. No wheezing or rhonchi.  Abdominal:     General: Bowel sounds are normal.  There is no distension.     Palpations: Abdomen is soft.     Tenderness: There is no abdominal tenderness. There is no guarding or rebound.  Musculoskeletal:     Cervical back: Neck supple.  Lymphadenopathy:     Cervical: No cervical adenopathy.  Skin:    General: Skin is warm and dry.  Neurological:     Mental Status: She is alert and oriented to person, place, and time.  Psychiatric:        Behavior: Behavior normal.        Judgment: Judgment normal.    BP 136/87 (BP Location: Right Arm, Patient Position: Sitting, Cuff Size: Small)   Pulse 65   Temp 97.8 F (36.6 C) (Oral)   Resp 16   Ht 5' 4.5" (1.638 m)   Wt 176 lb (79.8 kg)   SpO2 97%   BMI 29.74 kg/m  Wt Readings from Last 3 Encounters:  04/24/21 176 lb (79.8 kg)  01/22/21 177 lb (80.3 kg)  11/21/20 182 lb 3.2 oz (82.6 kg)    Diabetic Foot Exam - Simple   No data filed    Lab Results  Component Value Date   WBC 7.0 09/30/2020   HGB 15.4 (H) 09/30/2020   HCT 45.7 09/30/2020   PLT 286.0 09/30/2020   GLUCOSE 90 04/08/2020   CHOL 176 06/23/2020   TRIG 121.0 06/23/2020   HDL 55.50 06/23/2020   LDLDIRECT 125.0 01/29/2010   LDLCALC 96 06/23/2020   ALT 22 07/27/2019   AST 19 07/27/2019   NA 140 04/08/2020   K 4.4 04/08/2020   CL 103 04/08/2020   CREATININE 0.93 04/08/2020   BUN 21 04/08/2020   CO2 27 04/08/2020   TSH 2.55 04/08/2020    Lab Results  Component Value Date   TSH 2.55 04/08/2020   Lab Results  Component Value Date   WBC 7.0 09/30/2020   HGB 15.4 (H) 09/30/2020   HCT 45.7 09/30/2020   MCV 88.0 09/30/2020   PLT 286.0 09/30/2020   Lab Results  Component Value Date   NA 140 04/08/2020   K 4.4 04/08/2020   CO2 27 04/08/2020   GLUCOSE 90 04/08/2020   BUN 21 04/08/2020    CREATININE 0.93 04/08/2020   BILITOT 0.5 07/27/2019   ALKPHOS 100 04/02/2019   AST 19 07/27/2019   ALT 22 07/27/2019   PROT 7.6 07/27/2019   ALBUMIN 4.1 04/02/2019   CALCIUM 9.6 04/08/2020   Lab Results  Component Value Date   CHOL 176 06/23/2020   Lab Results  Component Value Date   HDL 55.50 06/23/2020   Lab Results  Component Value Date   LDLCALC 96 06/23/2020   Lab Results  Component Value Date   TRIG 121.0 06/23/2020   Lab Results  Component Value Date   CHOLHDL 3 06/23/2020   No results found for: HGBA1C     Assessment & Plan:   Problem List Items Addressed This Visit       Unprioritized   Secondary hyperparathyroidism, non-renal ([place])    Defer management to endocrinology.       Hypothyroidism    Lab Results  Component Value Date   TSH 2.55 04/08/2020  Clinically stable on synthroid. Defer management to endo.       Relevant Orders   TSH   Hyperlipidemia   Relevant Medications   amLODipine (NORVASC) 2.5 MG tablet   atorvastatin (LIPITOR) 20 MG tablet   Other Relevant Orders   Lipid panel  Essential hypertension    BP Readings from Last 3 Encounters:  04/24/21 136/87  01/22/21 127/74  11/21/20 (!) 127/54  Stable, continue amlodipine 2.50m.       Relevant Medications   amLODipine (NORVASC) 2.5 MG tablet   atorvastatin (LIPITOR) 20 MG tablet   Other Relevant Orders   Comp Met (CMET)   Other Visit Diagnoses     Preventative health care    -  Primary   Relevant Orders   MM 3D SCREEN BREAST BILATERAL      Encouraged pt to get the Bivalent Covid Booster.   Meds ordered this encounter  Medications   amLODipine (NORVASC) 2.5 MG tablet    Sig: Take 1 tablet (2.5 mg total) by mouth daily.    Dispense:  90 tablet    Refill:  1    Order Specific Question:   Supervising Provider    Answer:   BPenni HomansA [[id number]]   atorvastatin (LIPITOR) 20 MG tablet    Sig: Take 1 tablet (20 mg total) by mouth daily.    Dispense:  90 tablet     Refill:  1    Order Specific Question:   Supervising Provider    Answer:   BPenni HomansA [[id number]]    I,[name],acting as a scribe for MNance Pear NP.,have documented all relevant documentation on the behalf of MLYNNA ZAMORANO NP,as directed by  MNance Pear NP while in the presence of MNance Pear NP.   I, ODebbrah Alar NP , personally preformed the services described in this documentation.  All medical record entries made by the scribe were at my direction and in my presence.  I have reviewed the chart and discharge instructions (if applicable) and agree that the record reflects my personal performance and is accurate and complete. 04/24/2021

## 2021-04-24 NOTE — Patient Instructions (Signed)
Please complete lab work prior to leaving. Schedule a follow up appointment with Dr. Lonzo Cloud.

## 2021-04-24 NOTE — Assessment & Plan Note (Addendum)
Lab Results  Component Value Date   TSH 2.55 04/08/2020   Clinically stable on synthroid. Defer management to endo.

## 2021-04-24 NOTE — Assessment & Plan Note (Signed)
Defer management to endocrinology

## 2021-04-24 NOTE — Assessment & Plan Note (Signed)
BP Readings from Last 3 Encounters:  04/24/21 136/87  01/22/21 127/74  11/21/20 (!) 127/54   Stable, continue amlodipine 2.5mg .

## 2021-04-24 NOTE — Assessment & Plan Note (Signed)
She is overdue for follow up with endocrinology. Encouraged pt to schedule a follow up visit.

## 2021-04-25 LAB — COMPREHENSIVE METABOLIC PANEL
AG Ratio: 1.6 (calc) (ref 1.0–2.5)
ALT: 27 U/L (ref 6–29)
AST: 19 U/L (ref 10–35)
Albumin: 4.6 g/dL (ref 3.6–5.1)
Alkaline phosphatase (APISO): 95 U/L (ref 37–153)
BUN: 23 mg/dL (ref 7–25)
CO2: 27 mmol/L (ref 20–32)
Calcium: 10.3 mg/dL (ref 8.6–10.4)
Chloride: 102 mmol/L (ref 98–110)
Creat: 0.79 mg/dL (ref 0.50–1.03)
Globulin: 2.9 g/dL (calc) (ref 1.9–3.7)
Glucose, Bld: 93 mg/dL (ref 65–99)
Potassium: 4.5 mmol/L (ref 3.5–5.3)
Sodium: 139 mmol/L (ref 135–146)
Total Bilirubin: 0.6 mg/dL (ref 0.2–1.2)
Total Protein: 7.5 g/dL (ref 6.1–8.1)

## 2021-04-25 LAB — LIPID PANEL
Cholesterol: 191 mg/dL (ref <200)
HDL: 63 mg/dL (ref >=50)
LDL Cholesterol (Calc): 107 mg/dL (calc) — ABNORMAL HIGH
Non-HDL Cholesterol (Calc): 128 mg/dL (calc) (ref <130)
Total CHOL/HDL Ratio: 3 (calc) (ref <5.0)
Triglycerides: 109 mg/dL (ref <150)

## 2021-04-25 LAB — TSH: TSH: 5.27 mIU/L — ABNORMAL HIGH (ref 0.40–4.50)

## 2021-04-28 ENCOUNTER — Encounter: Payer: Self-pay | Admitting: Internal Medicine

## 2021-04-28 ENCOUNTER — Ambulatory Visit: Payer: BC Managed Care – PPO | Admitting: Internal Medicine

## 2021-04-28 ENCOUNTER — Other Ambulatory Visit: Payer: Self-pay

## 2021-04-28 VITALS — BP 124/80 | HR 88 | Ht 64.5 in | Wt 176.8 lb

## 2021-04-28 DIAGNOSIS — E039 Hypothyroidism, unspecified: Secondary | ICD-10-CM | POA: Diagnosis not present

## 2021-04-28 DIAGNOSIS — E211 Secondary hyperparathyroidism, not elsewhere classified: Secondary | ICD-10-CM | POA: Diagnosis not present

## 2021-04-28 DIAGNOSIS — E041 Nontoxic single thyroid nodule: Secondary | ICD-10-CM | POA: Diagnosis not present

## 2021-04-28 MED ORDER — LEVOTHYROXINE SODIUM 112 MCG PO TABS: 112.0000 ug | ORAL_TABLET | Freq: Every day | ORAL | 3 refills | Status: DC

## 2021-04-28 NOTE — Progress Notes (Signed)
Name: Melissa Gibson  MRN/ DOB: [id number], July 20, 1965    Age/ Sex: 55 y.o., female     PCP: Sandford Craze, NP   Reason for Endocrinology Evaluation: Right thyroid nodule      Initial Endocrinology Clinic Visit: 05/19/2017    PATIENT IDENTIFIER: Melissa Gibson is a 55 y.o., female with a past medical history of dyslipidemia and hypothyroidism. She has followed with [facility] since 05/19/2017 for consultative assistance with management of her hypothyroid/thyroid nodule .   HISTORICAL SUMMARY: The patient was first diagnosed with hypercalcemia in 2019, with a serum max of 10.7 mg/dL in 01/6194 ( non-corrected) . Her PTH level has been fluctuating between 38 - 84 pg/mL  She has not official diagnosis of renal stones  NO hx of bone fractures  DXA showed low bone density 10/2019   No FH of renal stones or hypercalcemia   Her labs have been normal since 2021   I personally don't believe she had a true hypercalcemia, the first time with serum calcium of 10.3 mg/dL , her albumin was 4.3 g/dL with a corrected calcium of 10.06 mg/dL, which is normal. The second time she had an elevated serum calcium, there was no concomitant albumin . But repeat serum calcium and ionized calcium has been normal.      THYROID HISTORY: She has been diagnosed with right thyroid nodule since 2018. This has been stable .  She has been on levothyroxine since 2016   SUBJECTIVE:     Today (04/28/2021):  Melissa Gibson is here for a follow up on hypothyroidism  and MNG.    She has been noted  with weight loss over the past year   She takes levothyroxine appropriately  Has occasional constipation  Has occasional palpitations  Neck size stable, denies dysphagia   She is not on Vitamin D nor calcium     HISTORY:  Past Medical History:  Past Medical History:  Diagnosis Date   Abnormal Pap smear of cervix    -age 55/unsure if she had any treatment/03-03-15 LSIL:Pos  HR HPV;LEEP 04-14-15 LGSIL w/neg margins   CIN II (cervical intraepithelial neoplasia II) 03/05/2016   Constipation 10/07/2020   COVID 01/12/2021   Dysmenorrhea    Hyperlipidemia    HYPERLIPIDEMIA 10/08/2008   Qualifier: Diagnosis of  By: Yetta Barre MD, Bernadene Bell.    Hyperparathyroidism (HCC) 10/22/2019   Hypothyroid 10/05/2010   Hypothyroidism 10/08/2008   Qualifier: Diagnosis of  By: Yetta Barre MD, Bernadene Bell.    Lateral epicondylitis  of elbow 12/27/2011   Lateral epicondylitis  of elbow 12/27/2011   Melasma 12/27/2011   Menorrhagia    Rectal bleeding 10/07/2020   Right thyroid nodule 04/08/2020   Secondary hyperparathyroidism, non-renal (HCC) 04/10/2020   Thyroid nodule 05/19/2017   Past Surgical History:  Past Surgical History:  Procedure Laterality Date   COLONOSCOPY W/ BIOPSIES  07/10/2014   Diverticulosis of large intestines and hyperplastic ployp X 1 recheck in 10 yrs.   LEEP  04/14/15   CIN I   NOVASURE ABLATION  11/2010   TUBAL LIGATION  2006   Social History:  reports that she quit smoking about 12 years ago. Her smoking use included cigarettes. She has a 30.00 pack-year smoking history. She has never used smokeless tobacco. She reports current alcohol use of about 1.0 - 2.0 standard drink per week. She reports that she does not use drugs. Family History:  Family History  Problem Relation Age of Onset   Heart  attack Father    Hypertension Father    Heart failure Father    Hyperlipidemia Father    Lung cancer Sister 53       lung cancer   Infertility Sister    Thyroid disease Sister    Cancer Sister        lung; died at 29yr   COPD Mother        on hospice   Hyperlipidemia Mother    Heart attack Brother 57   COPD Brother    Cirrhosis Brother        caused his death, ETOH abuse   Thyroid disease Sister        hypothyroid   Cancer Maternal Grandmother        lymph nodes   Arthritis Maternal Grandfather    Breast cancer Neg Hx    Hypercalcemia Neg Hx      HOME  MEDICATIONS: Allergies as of 04/28/2021   No Known Allergies      Medication List        Accurate as of April 28, 2021  8:11 AM. If you have any questions, ask your nurse or doctor.          amLODipine 2.5 MG tablet Commonly known as: NORVASC Take 1 tablet (2.5 mg total) by mouth daily.   atorvastatin 20 MG tablet Commonly known as: LIPITOR Take 1 tablet (20 mg total) by mouth daily.   levothyroxine 100 MCG tablet Commonly known as: SYNTHROID Take 1 tablet (100 mcg total) by mouth daily before breakfast.          OBJECTIVE:   PHYSICAL EXAM: VS: BP 124/80 (BP Location: Left Arm, Patient Position: Sitting, Cuff Size: Small)   Pulse 88   Ht 5' 4.5" (1.638 m)   Wt 176 lb 12.8 oz (80.2 kg)   SpO2 97%   BMI 29.88 kg/m    EXAM: General: Pt appears well and is in NAD  Neck: General: Supple without adenopathy. Thyroid: Thyroid size normal.  Right neck fullness appreciated.  Lungs: Clear with good BS bilat with no rales, rhonchi, or wheezes  Heart: Auscultation: RRR.  Abdomen: Normoactive bowel sounds, soft, nontender, without masses or organomegaly palpable  Extremities:  BL LE: No pretibial edema normal ROM and strength.  Mental Status: Judgment, insight: Intact Orientation: Oriented to time, place, and person Mood and affect: No depression, anxiety, or agitation     DATA REVIEWED:  Results for OVIE, EASTEP (MRN [id number]) as of 04/28/2021 07:22  Ref. Range 04/24/2021 14:34 04/24/2021 14:37  Sodium Latest Ref Range: 135 - 146 mmol/L 139   Potassium Latest Ref Range: 3.5 - 5.3 mmol/L 4.5   Chloride Latest Ref Range: 98 - 110 mmol/L 102   CO2 Latest Ref Range: 20 - 32 mmol/L 27   Glucose Latest Ref Range: 65 - 99 mg/dL 93   BUN Latest Ref Range: 7 - 25 mg/dL 23   Creatinine Latest Ref Range: 0.50 - 1.03 mg/dL 9.24   Calcium Latest Ref Range: 8.6 - 10.4 mg/dL 26.8   BUN/Creatinine Ratio Latest Ref Range: 6 - 22 (calc) NOT APPLICABLE   AG Ratio  Latest Ref Range: 1.0 - 2.5 (calc) 1.6   AST Latest Ref Range: 10 - 35 U/L 19   ALT Latest Ref Range: 6 - 29 U/L 27   Total Protein Latest Ref Range: 6.1 - 8.1 g/dL 7.5   Total Bilirubin Latest Ref Range: 0.2 - 1.2 mg/dL 0.6   Total CHOL/HDL Ratio Latest  Ref Range: <5.0 (calc)  3.0  Cholesterol Latest Ref Range: <200 mg/dL  035  HDL Cholesterol Latest Ref Range: > OR = 50 mg/dL  63  LDL Cholesterol (Calc) Latest Units: mg/dL (calc)  465 (H)  Non-HDL Cholesterol (Calc) Latest Ref Range: <130 mg/dL (calc)  681  Triglycerides Latest Ref Range: <150 mg/dL  275  Alkaline phosphatase (APISO) Latest Ref Range: 37 - 153 U/L 95   Globulin Latest Ref Range: 1.9 - 3.7 g/dL (calc) 2.9      Thyroid Ultrasound 04/25/2020 Estimated total number of nodules >/= 1 cm: 1   Number of spongiform nodules >/=  2 cm not described below (TR1): 0   Number of mixed cystic and solid nodules >/= 1.5 cm not described below (TR2): 0   _________________________________________________________   Nodule labeled 1 superior right thyroid, 6 mm. This nodule does not meet criteria for surveillance or biopsy.   Nodule labeled 2 in the mid right thyroid, 1.9 cm, previously 1.7 cm. This remains TR 3 and meets criteria for continued surveillance.   No adenopathy   IMPRESSION: Right mid thyroid nodule (labeled 2, TR 3, 1.9 cm) meets criteria for surveillance, as designated by the newly established ACR TI-RADS criteria. Surveillance ultrasound study recommended to be performed annually up to 5 years.    ASSESSMENT / PLAN / RECOMMENDATIONS:   1. Right thyroid nodule:  - Pt denies any local neck symptoms  - Thyroid ultrasound had shown stable thyroid nodule in the past, will repeat this year  2. Hypothyroidism:  - Pt is clinically euthyroid , but TSH elevated  - Pt assures me compliance - Will increase as below and recheck in 8 weeks    Medication Stop Levothyroxine 100 mcg daily  Start Levothyroxine 112  mcg daily    3. Secondary Hyperparathyroidism :  - We emphasized the importance of vitamin d and calcium intake on daily basis, we emphasized the importance of these to bone health  - Pt will start Vitamin D 1000 iu daily and consume 2-3 servings of dietary calcium daily  - She has had hx of elevated serum calcium but normal ionized calcium  - Will check PTH , ionized calcium and Vitamin D on next blood draw   F/U in 6 months    Signed electronically by: Lyndle Herrlich, MD  [facility] Endocrinology  [facility] [address] [place], [place] [address] Phone: [phone] FAX: [phone]      CC: Maizy, Davanzo, NP [address] [place] [address] Phone: [phone]  Fax: [phone]   Return to Endocrinology clinic as below: Future Appointments  Date Time Provider Department Center  04/28/2021  8:30 AM Bailee Metter, Konrad Dolores, MD [facility]  07/07/2021  9:00 AM Sandford Craze, NP [facility]

## 2021-04-28 NOTE — Patient Instructions (Signed)
Please consume 2-3 servings of dietary calcium on daily basis  Please start Vitamin D3 1000 iu daily

## 2021-06-16 ENCOUNTER — Ambulatory Visit
Admission: RE | Admit: 2021-06-16 | Discharge: 2021-06-16 | Disposition: A | Discharge status: Home / Self Care | Payer: BC Managed Care – PPO | Source: Ambulatory Visit | Attending: Family | Admitting: Family

## 2021-06-16 DIAGNOSIS — Z0000 Encounter for general adult medical examination without abnormal findings: Secondary | ICD-10-CM

## 2021-06-16 DIAGNOSIS — Z1231 Encounter for screening mammogram for malignant neoplasm of breast: Secondary | ICD-10-CM | POA: Diagnosis not present

## 2021-07-07 ENCOUNTER — Encounter: Payer: BC Managed Care – PPO | Admitting: Family

## 2021-07-14 ENCOUNTER — Encounter: Payer: BC Managed Care – PPO | Admitting: Family

## 2021-07-24 ENCOUNTER — Encounter: Payer: Self-pay | Admitting: Family

## 2021-07-24 ENCOUNTER — Ambulatory Visit (INDEPENDENT_AMBULATORY_CARE_PROVIDER_SITE_OTHER): Payer: BC Managed Care – PPO | Admitting: Family

## 2021-07-24 VITALS — BP 135/85 | HR 74 | Temp 98.2°F | Resp 16 | Ht 64.5 in | Wt 176.0 lb

## 2021-07-24 DIAGNOSIS — Z23 Encounter for immunization: Secondary | ICD-10-CM

## 2021-07-24 DIAGNOSIS — Z0000 Encounter for general adult medical examination without abnormal findings: Secondary | ICD-10-CM | POA: Insufficient documentation

## 2021-07-24 DIAGNOSIS — E785 Hyperlipidemia, unspecified: Secondary | ICD-10-CM

## 2021-07-24 DIAGNOSIS — F418 Other specified anxiety disorders: Secondary | ICD-10-CM | POA: Insufficient documentation

## 2021-07-24 MED ORDER — ESCITALOPRAM OXALATE 10 MG PO TABS: ORAL_TABLET | ORAL | 0 refills | Status: DC

## 2021-07-24 NOTE — Progress Notes (Signed)
Subjective:     Patient ID: Melissa Gibson, female    DOB: [birth year], 56 y.o.   MRN: [id number]  No chief complaint on file.   HPI  Patient presents today for complete physical.  Immunizations:due for Td Diet: stable Wt Readings from Last 3 Encounters:  07/24/21 176 lb (79.8 kg)  04/28/21 176 lb 12.8 oz (80.2 kg)  04/24/21 176 lb (79.8 kg)  Exercise: walk Colonoscopy: 07/08/14 Pap Smear: scheduled with GYN Mammogram: 06/16/21 Vision: up to date Dental:  up to date  Reports feeling very irritated at work. She is finding it difficult for her to not feel rageful at work. She feels that her mood issues at work are bad enough that she would like to start a medication.    Health Maintenance Due  Topic Date Due   COVID-19 Vaccine (4 - Booster for [employer] series) 07/30/2020    Past Medical History:  Diagnosis Date   Abnormal Pap smear of cervix    -age 19/unsure if she had any treatment/03-03-15 LSIL:Pos HR HPV;LEEP 04-14-15 LGSIL w/neg margins   CIN II (cervical intraepithelial neoplasia II) 03/05/2016   Constipation 10/07/2020   COVID 01/12/2021   Dysmenorrhea    Hyperlipidemia    HYPERLIPIDEMIA 10/08/2008   Qualifier: Diagnosis of  By: Ronnald Ramp MD, Arvid Right.    Hyperparathyroidism ([place]) 10/22/2019   Hypothyroid 10/05/2010   Hypothyroidism 10/08/2008   Qualifier: Diagnosis of  By: Ronnald Ramp MD, Arvid Right.    Lateral epicondylitis  of elbow 12/27/2011   Lateral epicondylitis  of elbow 12/27/2011   Melasma 12/27/2011   Menorrhagia    Rectal bleeding 10/07/2020   Right thyroid nodule 04/08/2020   Secondary hyperparathyroidism, non-renal ([place]) 04/10/2020   Thyroid nodule 05/19/2017    Past Surgical History:  Procedure Laterality Date   COLONOSCOPY W/ BIOPSIES  07/10/2014   Diverticulosis of large intestines and hyperplastic ployp X 1 recheck in 10 yrs.   LEEP  04/14/15   CIN I   NOVASURE ABLATION  11/2010   TUBAL LIGATION  2006    Family History  Problem Relation Age  of Onset   COPD Mother        on hospice   Hyperlipidemia Mother    Heart attack Father    Hypertension Father    Heart failure Father    Hyperlipidemia Father    Lung cancer Sister 63       lung cancer   Infertility Sister    Thyroid disease Sister    Cancer Sister        lung; died at 107yr  Thyroid disease Sister        hypothyroid   Heart attack Brother 542  COPD Brother    Cirrhosis Brother        caused his death, ETOH abuse   Cancer Maternal Grandmother        lymph nodes   Arthritis Maternal Grandfather    Breast cancer Neg Hx    Hypercalcemia Neg Hx     Social History   Socioeconomic History   Marital status: Married    Spouse name: Not on file   Number of children: Not on file   Years of education: Not on file   Highest education level: Not on file  Occupational History   Occupation: SALES  Tobacco Use   Smoking status: Former    Packs/day: 1.00    Years: 30.00    Pack years: 30.00    Types: Cigarettes  Quit date: 07/05/2008    Years since quitting: 13.0   Smokeless tobacco: Never   Tobacco comments:    quit in 2010  Substance and Sexual Activity   Alcohol use: Yes    Alcohol/week: 1.0 - 2.0 standard drink    Types: 1 - 2 Standard drinks or equivalent per week   Drug use: No   Sexual activity: Yes    Partners: Male    Birth control/protection: Surgical    Comment: BTL, Ablation  Other Topics Concern   Not on file  Social History Narrative   Regular Exercise    Former smoker   internet sales   Married   No children1 year of college   Enjoys crafts, Haematologist.   Social Determinants of Health   Financial Resource Strain: Not on file  Food Insecurity: Not on file  Transportation Needs: Not on file  Physical Activity: Not on file  Stress: Not on file  Social Connections: Not on file  Intimate Partner Violence: Not on file    Outpatient Medications Prior to Visit  Medication Sig Dispense Refill   amLODipine (NORVASC) 2.5 MG tablet  Take 1 tablet (2.5 mg total) by mouth daily. 90 tablet 1   atorvastatin (LIPITOR) 20 MG tablet Take 1 tablet (20 mg total) by mouth daily. 90 tablet 1   levothyroxine (SYNTHROID) 112 MCG tablet Take 1 tablet (112 mcg total) by mouth daily. 90 tablet 3   No facility-administered medications prior to visit.    No Known Allergies  ROS See HPI    Objective:    Physical Exam  BP 135/85 (BP Location: Right Arm, Patient Position: Sitting, Cuff Size: Small)    Pulse 74    Temp 98.2 F (36.8 C) (Oral)    Resp 16    Ht 5' 4.5" (1.638 m)    Wt 176 lb (79.8 kg)    SpO2 98%    BMI 29.74 kg/m  Wt Readings from Last 3 Encounters:  07/24/21 176 lb (79.8 kg)  04/28/21 176 lb 12.8 oz (80.2 kg)  04/24/21 176 lb (79.8 kg)   Physical Exam  Constitutional: She is oriented to person, place, and time. She appears well-developed and well-nourished. No distress.  HENT:  Head: Normocephalic and atraumatic.  Right Ear: Tympanic membrane and ear canal normal.  Left Ear: Tympanic membrane and ear canal normal.  Mouth/Throat: not examined- pt wearing mask Eyes: Pupils are equal, round, and reactive to light. No scleral icterus.  Neck: Normal range of motion. No thyromegaly present.  Cardiovascular: Normal rate and regular rhythm.   No murmur heard. Pulmonary/Chest: Effort normal and breath sounds normal. No respiratory distress. He has no wheezes. She has no rales. She exhibits no tenderness.  Abdominal: Soft. Bowel sounds are normal. She exhibits no distension and no mass. There is no tenderness. There is no rebound and no guarding.  Musculoskeletal: She exhibits no edema.  Lymphadenopathy:    She has no cervical adenopathy.  Neurological: She is alert and oriented to person, place, and time. She has normal patellar reflexes. She exhibits normal muscle tone. Coordination normal.  Skin: Skin is warm and dry.  Psychiatric: She has a normal mood and affect. Her behavior is normal. Judgment and thought  content normal.  Breast/Pelvis: deferred          Assessment & Plan:       Assessment & Plan:   Problem List Items Addressed This Visit       Unprioritized   Preventative  health care - Primary    Discussed healthy diet, exercise and weight loss. Td today. Recommended that she get the bivalent covid booster at the pharmacy. Labs as ordered.  Pap/colo/mammo up to date.       Hyperlipidemia   Relevant Orders   Comp Met (CMET)   Lipid panel   Anxiety with depression    New. Will rx with trial of lexapro 6m.  I instructed pt to start 1/2 tablet once daily for 1 week and then increase to a full tablet once daily on week two as tolerated.  We discussed common side effects such as nausea, drowsiness and weight gain. She is instructed to discontinue medication go directly to ED if this occurs.  Pt verbalizes understanding.  Plan follow up in 1 month to evaluate progress.          Relevant Medications   escitalopram (LEXAPRO) 10 MG tablet   Other Visit Diagnoses     Need for Td vaccine       Relevant Orders   Td : Tetanus/diphtheria >7yo Preservative  free (Completed)       I am having Iana A. SConley Canalstart on escitalopram. I am also having her maintain her amLODipine, atorvastatin, and levothyroxine.  Meds ordered this encounter  Medications   escitalopram (LEXAPRO) 10 MG tablet    Sig: 1/2 tab once daily for 1 week, then increase to a full tab on week two    Dispense:  30 tablet    Refill:  0    Order Specific Question:   Supervising Provider    Answer:   BPenni HomansA [[id number]]

## 2021-07-24 NOTE — Patient Instructions (Addendum)
Start lexapro 1/2 tab once daily for 1 week, then increase to a full tab once daily on week two.  

## 2021-07-24 NOTE — Assessment & Plan Note (Signed)
Discussed healthy diet, exercise and weight loss. Td today. Recommended that she get the bivalent covid booster at the pharmacy. Labs as ordered.  Pap/colo/mammo up to date.

## 2021-07-24 NOTE — Assessment & Plan Note (Signed)
New. Will rx with trial of lexapro 10mg .  I instructed pt to start 1/2 tablet once daily for 1 week and then increase to a full tablet once daily on week two as tolerated.  We discussed common side effects such as nausea, drowsiness and weight gain. She is instructed to discontinue medication go directly to ED if this occurs.  Pt verbalizes understanding.  Plan follow up in 1 month to evaluate progress.

## 2021-07-25 LAB — COMPREHENSIVE METABOLIC PANEL
AG Ratio: 1.6 (calc) (ref 1.0–2.5)
ALT: 29 U/L (ref 6–29)
AST: 22 U/L (ref 10–35)
Albumin: 4.4 g/dL (ref 3.6–5.1)
Alkaline phosphatase (APISO): 85 U/L (ref 37–153)
BUN: 19 mg/dL (ref 7–25)
CO2: 30 mmol/L (ref 20–32)
Calcium: 10.7 mg/dL — ABNORMAL HIGH (ref 8.6–10.4)
Chloride: 101 mmol/L (ref 98–110)
Creat: 0.87 mg/dL (ref 0.50–1.03)
Globulin: 2.8 g/dL (calc) (ref 1.9–3.7)
Glucose, Bld: 92 mg/dL (ref 65–99)
Potassium: 4.3 mmol/L (ref 3.5–5.3)
Sodium: 139 mmol/L (ref 135–146)
Total Bilirubin: 0.9 mg/dL (ref 0.2–1.2)
Total Protein: 7.2 g/dL (ref 6.1–8.1)

## 2021-07-25 LAB — LIPID PANEL
Cholesterol: 172 mg/dL (ref <200)
HDL: 62 mg/dL (ref >=50)
LDL Cholesterol (Calc): 91 mg/dL (calc)
Non-HDL Cholesterol (Calc): 110 mg/dL (calc) (ref <130)
Total CHOL/HDL Ratio: 2.8 (calc) (ref <5.0)
Triglycerides: 97 mg/dL (ref <150)

## 2021-07-26 ENCOUNTER — Telehealth: Payer: Self-pay | Admitting: Family

## 2021-07-26 NOTE — Telephone Encounter (Signed)
Calcium is mildly elevated.  Is she currently taking a calcium supplement?  If so, please d/c.  I would like to repeat her lab work in 1 month.

## 2021-07-27 NOTE — Telephone Encounter (Signed)
Patient advised of results and provider's comments. She has a one month follow up scheduled for 3-17 and will get labs that same day

## 2021-08-21 ENCOUNTER — Ambulatory Visit: Payer: BC Managed Care – PPO | Admitting: Family

## 2021-09-18 ENCOUNTER — Ambulatory Visit (HOSPITAL_BASED_OUTPATIENT_CLINIC_OR_DEPARTMENT_OTHER): Payer: BC Managed Care – PPO | Admitting: Obstetrics & Gynecology

## 2021-09-25 ENCOUNTER — Ambulatory Visit (HOSPITAL_BASED_OUTPATIENT_CLINIC_OR_DEPARTMENT_OTHER)
Admission: RE | Admit: 2021-09-25 | Discharge: 2021-09-25 | Disposition: A | Discharge status: Home / Self Care | Payer: BC Managed Care – PPO | Source: Ambulatory Visit | Attending: Acute Care | Admitting: Acute Care

## 2021-09-25 ENCOUNTER — Other Ambulatory Visit: Payer: Self-pay

## 2021-09-25 DIAGNOSIS — Z87891 Personal history of nicotine dependence: Secondary | ICD-10-CM | POA: Diagnosis not present

## 2021-09-30 ENCOUNTER — Other Ambulatory Visit: Payer: Self-pay

## 2021-09-30 DIAGNOSIS — Z87891 Personal history of nicotine dependence: Secondary | ICD-10-CM

## 2021-11-12 ENCOUNTER — Other Ambulatory Visit (HOSPITAL_COMMUNITY)
Admission: RE | Admit: 2021-11-12 | Discharge: 2021-11-12 | Disposition: A | Discharge status: Home / Self Care | Payer: BC Managed Care – PPO | Source: Ambulatory Visit | Attending: Obstetrics & Gynecology | Admitting: Obstetrics & Gynecology

## 2021-11-12 ENCOUNTER — Encounter (HOSPITAL_BASED_OUTPATIENT_CLINIC_OR_DEPARTMENT_OTHER): Payer: Self-pay | Admitting: Obstetrics & Gynecology

## 2021-11-12 ENCOUNTER — Ambulatory Visit (HOSPITAL_BASED_OUTPATIENT_CLINIC_OR_DEPARTMENT_OTHER): Payer: BC Managed Care – PPO | Admitting: Obstetrics & Gynecology

## 2021-11-12 VITALS — BP 165/71 | HR 62 | Ht 64.5 in | Wt 177.8 lb

## 2021-11-12 DIAGNOSIS — Z01419 Encounter for gynecological examination (general) (routine) without abnormal findings: Secondary | ICD-10-CM | POA: Diagnosis not present

## 2021-11-12 DIAGNOSIS — I10 Essential (primary) hypertension: Secondary | ICD-10-CM

## 2021-11-12 DIAGNOSIS — Z124 Encounter for screening for malignant neoplasm of cervix: Secondary | ICD-10-CM | POA: Insufficient documentation

## 2021-11-12 DIAGNOSIS — Z9889 Other specified postprocedural states: Secondary | ICD-10-CM

## 2021-11-12 DIAGNOSIS — Z801 Family history of malignant neoplasm of trachea, bronchus and lung: Secondary | ICD-10-CM | POA: Diagnosis not present

## 2021-11-12 NOTE — Progress Notes (Signed)
56 y.o. G0P0000 Married White or Caucasian female here for annual exam/new patient appointment.  Former patient of [place], [place].  Does have hx of abnormal pap smear in 2016.  Had LEEP at that time.   ? ?H/O endometrial ablation more than 10 years ago.  Really has not had much bleeding since that time.  None recently. ? ?No LMP recorded. Patient has had an ablation.          ?Sexually active: Yes.    ?The current method of family planning is post menopausal status.    ?Exercising: Yes.    ?Smoker:  former ? ?Health Maintenance: ?Pap:  obtained today ?History of abnormal Pap:  yes, LEEP 2016 ?MMG:  06/2021 ?Colonoscopy:  2016, follow up 10 year ?BMD:   10/2019, t score -1.8 ?Screening Labs: does with [name] ? ? reports that she quit smoking about 13 years ago. Her smoking use included cigarettes. She has a 30.00 pack-year smoking history. She has never used smokeless tobacco. She reports current alcohol use of about 1.0 - 2.0 standard drink per week. She reports that she does not use drugs. ? ?Past Medical History:  ?Diagnosis Date  ? Abnormal Pap smear of cervix   ? -age 14/unsure if she had any treatment/03-03-15 LSIL:Pos HR HPV;LEEP 04-14-15 LGSIL w/neg margins  ? CIN II (cervical intraepithelial neoplasia II) 03/05/2016  ? Constipation 10/07/2020  ? COVID 01/12/2021  ? HYPERLIPIDEMIA 10/08/2008  ? Qualifier: Diagnosis of  By: Ronnald Ramp MD, Arvid Right.   ? Hyperparathyroidism ([place]) 10/22/2019  ? Hypothyroidism 10/08/2008  ? Qualifier: Diagnosis of  By: Ronnald Ramp MD, Arvid Right.   ? Lateral epicondylitis  of elbow 12/27/2011  ? Melasma 12/27/2011  ? Rectal bleeding 10/07/2020  ? Right thyroid nodule 04/08/2020  ? Secondary hyperparathyroidism, non-renal ([place]) 04/10/2020  ? Thyroid nodule 05/19/2017  ? ? ?Past Surgical History:  ?Procedure Laterality Date  ? COLONOSCOPY W/ BIOPSIES  07/10/2014  ? Diverticulosis of large intestines and hyperplastic ployp X 1 recheck in 10 yrs.  ? LEEP  04/14/2015  ? CIN I/2  ?  NOVASURE ABLATION  11/03/2010  ? TUBAL LIGATION  07/05/2004  ? ? ?Current Outpatient Medications  ?Medication Sig Dispense Refill  ? amLODipine (NORVASC) 2.5 MG tablet Take 1 tablet (2.5 mg total) by mouth daily. 90 tablet 1  ? atorvastatin (LIPITOR) 20 MG tablet Take 1 tablet (20 mg total) by mouth daily. 90 tablet 1  ? levothyroxine (SYNTHROID) 112 MCG tablet Take 1 tablet (112 mcg total) by mouth daily. 90 tablet 3  ? ?No current facility-administered medications for this visit.  ? ? ?Family History  ?Problem Relation Age of Onset  ? COPD Mother   ?     on hospice  ? Hyperlipidemia Mother   ? Heart attack Father   ? Hypertension Father   ? Heart failure Father   ? Hyperlipidemia Father   ? Lung cancer Sister 24  ?     lung cancer  ? Infertility Sister   ? Thyroid disease Sister   ? Cancer Sister   ?     lung; died at 46yr  ? Thyroid disease Sister   ?     hypothyroid  ? Heart attack Brother 76  ? COPD Brother   ? Cirrhosis Brother   ?     caused his death, ETOH abuse  ? Cancer Maternal Grandmother   ?     lymph nodes  ? Arthritis Maternal Grandfather   ? Breast  cancer Neg Hx   ? Hypercalcemia Neg Hx   ? ? ?Genitourinary:negative ? ?Exam:   ?BP (!) 165/71 (BP Location: Left Arm, Patient Position: Sitting, Cuff Size: Large)   Pulse 62   Ht 5' 4.5" (1.638 m) Comment: reported  Wt 177 lb 12.8 oz (80.6 kg)   BMI 30.05 kg/m?   Height: 5' 4.5" (163.8 cm) (reported) ? ?General appearance: alert, cooperative and appears stated age ?Head: Normocephalic, without obvious abnormality, atraumatic ?Neck: no adenopathy, supple, symmetrical, trachea midline and thyroid normal to inspection and palpation ?Lungs: clear to auscultation bilaterally ?Breasts: normal appearance, no masses or tenderness ?Heart: regular rate and rhythm ?Abdomen: soft, non-tender; bowel sounds normal; no masses,  no organomegaly ?Extremities: extremities normal, atraumatic, no cyanosis or edema ?Skin: Skin color, texture, turgor normal. No rashes or  lesions ?Lymph nodes: Cervical, supraclavicular, and axillary nodes normal. ?No abnormal inguinal nodes palpated ?Neurologic: Grossly normal ? ? ?Pelvic: External genitalia:  no lesions ?             Urethra:  normal appearing urethra with no masses, tenderness or lesions ?             Bartholins and Skenes: normal    ?             Vagina: normal appearing vagina with normal color and no discharge, no lesions ?             Cervix: no lesions ?             Pap taken: Yes.   ?Bimanual Exam:  Uterus:  normal size, contour, position, consistency, mobility, non-tender ?             Adnexa: normal adnexa and no mass, fullness, tenderness ?              Rectovaginal: Confirms ?              Anus:  normal sphincter tone, no lesions ? ?Chaperone, Octaviano Batty, CMA, was present for exam. ? ?Assessment/Plan: ?1. Well woman exam with routine gynecological exam ?- pap and HR HPV obtained today ?- MMG 06/2021 ?- colonoscopy 2016, follow up 10 years ?- BMD 2021 ?- lab work done with PCP ?- vaccines reviewed/updated ? ?2. Cervical cancer screening ?- Cytology - PAP( [facility]) ? ?3. Essential hypertension ? ?4. Family history of lung cancer ?- being followed closely and having CT screening ? ?5. History of loop electrical excision procedure (LEEP) ?- CIN 1/2 s/p LEEP 2016 ? ?6. History of endometrial ablation ?- 2012 ? ?

## 2021-11-15 ENCOUNTER — Encounter (HOSPITAL_BASED_OUTPATIENT_CLINIC_OR_DEPARTMENT_OTHER): Payer: Self-pay | Admitting: Obstetrics & Gynecology

## 2021-11-15 DIAGNOSIS — Z801 Family history of malignant neoplasm of trachea, bronchus and lung: Secondary | ICD-10-CM | POA: Insufficient documentation

## 2021-11-15 DIAGNOSIS — Z9889 Other specified postprocedural states: Secondary | ICD-10-CM | POA: Insufficient documentation

## 2021-11-19 LAB — CYTOLOGY - PAP
Adequacy: ABSENT
Comment: NEGATIVE
Diagnosis: NEGATIVE
Diagnosis: REACTIVE
High risk HPV: NEGATIVE

## 2022-01-14 ENCOUNTER — Other Ambulatory Visit: Payer: Self-pay | Admitting: Family

## 2022-02-11 ENCOUNTER — Other Ambulatory Visit: Payer: Self-pay | Admitting: Family

## 2022-02-15 ENCOUNTER — Telehealth: Payer: Self-pay | Admitting: Family

## 2022-02-15 ENCOUNTER — Other Ambulatory Visit: Payer: Self-pay

## 2022-02-15 MED ORDER — AMLODIPINE BESYLATE 2.5 MG PO TABS: 2.5000 mg | ORAL_TABLET | Freq: Every day | ORAL | 1 refills | Status: DC

## 2022-02-15 NOTE — Telephone Encounter (Signed)
Rx sent again

## 2022-02-15 NOTE — Telephone Encounter (Signed)
Pharmacy stated they never recived rx on 7/13 and is needing that sent in for pt.   Medication: amLODipine (NORVASC) 2.5 MG tablet   Has the patient contacted their pharmacy? Yes.     Preferred Pharmacy:  [facility] - [place], [place] - [address]   [address] [place] [address]  Phone:  [phone]  Fax:  [phone]

## 2022-02-15 NOTE — Telephone Encounter (Signed)
Pharmacy called back and states she meant the atorvastatin.

## 2022-02-16 ENCOUNTER — Other Ambulatory Visit: Payer: Self-pay

## 2022-02-16 MED ORDER — ATORVASTATIN CALCIUM 20 MG PO TABS: 20.0000 mg | ORAL_TABLET | Freq: Every day | ORAL | 1 refills | Status: DC

## 2022-02-16 NOTE — Telephone Encounter (Signed)
Atorvastatin was last prescribed in Oct 2022, this is why they don't have records from July 2023. Rx sent.

## 2022-03-02 DIAGNOSIS — F1210 Cannabis abuse, uncomplicated: Secondary | ICD-10-CM | POA: Diagnosis not present

## 2022-03-15 DIAGNOSIS — F1210 Cannabis abuse, uncomplicated: Secondary | ICD-10-CM | POA: Diagnosis not present

## 2022-03-16 DIAGNOSIS — F1210 Cannabis abuse, uncomplicated: Secondary | ICD-10-CM | POA: Diagnosis not present

## 2022-06-14 ENCOUNTER — Ambulatory Visit: Payer: BC Managed Care – PPO | Admitting: Internal Medicine

## 2022-06-14 ENCOUNTER — Encounter: Payer: Self-pay | Admitting: Internal Medicine

## 2022-06-14 VITALS — BP 134/76 | HR 72 | Ht 64.5 in | Wt 182.0 lb

## 2022-06-14 DIAGNOSIS — E041 Nontoxic single thyroid nodule: Secondary | ICD-10-CM | POA: Diagnosis not present

## 2022-06-14 DIAGNOSIS — E039 Hypothyroidism, unspecified: Secondary | ICD-10-CM

## 2022-06-14 DIAGNOSIS — E213 Hyperparathyroidism, unspecified: Secondary | ICD-10-CM | POA: Diagnosis not present

## 2022-06-14 NOTE — Patient Instructions (Addendum)
Avoid over the counter calcium  Consume 2-3 servings of calcium through food daily (low fat dairy, green leafy vegetables)

## 2022-06-14 NOTE — Progress Notes (Unsigned)
Name: Melissa Gibson  MRN/ DOB: [id number], 1965-10-01    Age/ Sex: 56 y.o., female     PCP: Sandford Craze, NP   Reason for Endocrinology Evaluation: Right thyroid nodule      Initial Endocrinology Clinic Visit: 05/19/2017    PATIENT IDENTIFIER: Melissa Gibson is a 56 y.o., female with a past medical history of dyslipidemia and hypothyroidism. She has followed with  Endocrinology clinic since 05/19/2017 for consultative assistance with management of her hypothyroid/thyroid nodule .   HISTORICAL SUMMARY: The patient was first diagnosed with hypercalcemia in 2019, with a serum max of 10.7 mg/dL in 09/158 ( non-corrected) . Her PTH level has been fluctuating between 38 - 84 pg/mL  She has not official diagnosis of renal stones  NO hx of bone fractures  DXA showed low bone density 10/2019   No FH of renal stones or hypercalcemia   Her labs have been normal since 2021  I am not sure if she truly had  true hypercalcemia, the first time with serum calcium of 10.3 mg/dL , her albumin was 4.3 g/dL with a corrected calcium of 10.06 mg/dL, which is normal. The second time she had an elevated serum calcium, there was no concomitant albumin . But repeat serum calcium and ionized calcium has been normal.      THYROID HISTORY: She has been diagnosed with right thyroid nodule since 2018. This has been stable .  She has been on levothyroxine since 2016   SUBJECTIVE:     Today (06/14/2022):  Melissa Gibson is here for a follow up on hypothyroidism  and MNG.   Denies constipation or diarrhea  Has occasional palpitations  Has noted increase in neck size, denies dysphagia    She is also on Calcium 500 mg but takes it inconsistently    Vitamin D 1000 iu daily  when she remembers  Levothyroxine 112 mcg daily     HISTORY:  Past Medical History:  Past Medical History:  Diagnosis Date   Abnormal Pap smear of cervix    -age 29/unsure if she had any  treatment/03-03-15 LSIL:Pos HR HPV;LEEP 04-14-15 LGSIL w/neg margins   CIN II (cervical intraepithelial neoplasia II) 03/05/2016   Constipation 10/07/2020   COVID 01/12/2021   HYPERLIPIDEMIA 10/08/2008   Qualifier: Diagnosis of  By: Yetta Barre MD, Bernadene Bell.    Hyperparathyroidism (HCC) 10/22/2019   Hypothyroidism 10/08/2008   Qualifier: Diagnosis of  By: Yetta Barre MD, Bernadene Bell.    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   LEEP  04/14/2015   CIN I/2   NOVASURE ABLATION  11/03/2010   TUBAL LIGATION  07/05/2004   Social History:  reports that she quit smoking about 13 years ago. Her smoking use included cigarettes. She has a 30.00 pack-year smoking history. She has never used smokeless tobacco. She reports current alcohol use of about 1.0 - 2.0 standard drink of alcohol per week. She reports that she does not use drugs. Family History:  Family History  Problem Relation Age of Onset   COPD Mother        on hospice   Hyperlipidemia Mother  Heart attack Father    Hypertension Father    Heart failure Father    Hyperlipidemia Father    Lung cancer Sister 68       lung cancer   Infertility Sister    Thyroid disease Sister    Cancer Sister        lung; died at 72yr   Thyroid disease Sister        hypothyroid   Heart attack Brother 85   COPD Brother    Cirrhosis Brother        caused his death, ETOH abuse   Cancer Maternal Grandmother        lymph nodes   Arthritis Maternal Grandfather    Breast cancer Neg Hx    Hypercalcemia Neg Hx      HOME MEDICATIONS: Allergies as of 06/14/2022   No Known Allergies       Medication List        Accurate as of June 14, 2022  3:17 PM. If you have any questions, ask your nurse or doctor.          amLODipine 2.5 MG tablet Commonly known as: NORVASC Take 1 tablet (2.5 mg total) by mouth daily.   atorvastatin 20 MG tablet Commonly known as: LIPITOR Take 1 tablet (20 mg total) by mouth daily.   levothyroxine 112 MCG tablet Commonly known as: SYNTHROID Take 1 tablet (112 mcg total) by mouth daily.          OBJECTIVE:   PHYSICAL EXAM: VS: BP 134/76 (BP Location: Left Arm, Patient Position: Sitting, Cuff Size: Large)   Pulse 72   Ht 5' 4.5" (1.638 m)   Wt 182 lb (82.6 kg)   SpO2 96%   BMI 30.76 kg/m    EXAM: General: Pt appears well and is in NAD  Neck: General: Supple without adenopathy. Thyroid: Right thyroid nodule appreciated   Lungs: Clear with good BS bilat with no rales, rhonchi, or wheezes  Heart: Auscultation: RRR.  Abdomen: Normoactive bowel sounds, soft, nontender, without masses or organomegaly palpable  Extremities:  BL LE: No pretibial edema normal ROM and strength.  Mental Status: Judgment, insight: Intact Orientation: Oriented to time, place, and person Mood and affect: No depression, anxiety, or agitation     DATA REVIEWED:   Latest Reference Range & Units 06/14/22 15:22  Sodium 135 - 145 mEq/L 139  Potassium 3.5 - 5.1 mEq/L 4.5  Chloride 96 - 112 mEq/L 102  CO2 19 - 32 mEq/L 30  Glucose 70 - 99 mg/dL 84  BUN 6 - 23 mg/dL 20  Creatinine 4.13 - 2.44 mg/dL 0.10  Calcium 8.4 - 27.2 mg/dL 53.6  Calcium Ionized 4.7 - 5.5 mg/dL 5.4  Albumin 3.5 - 5.2 g/dL 4.5  GFR >64.40 mL/min 73.57    Latest Reference Range & Units 06/14/22 15:22  VITD 30.00 - 100.00 ng/mL 26.29 (L)    Latest Reference Range & Units 06/14/22 15:22  PTH, Intact 16 - 77 pg/mL 55  TSH 0.35 - 5.50 uIU/mL 7.51 (H)  (H): Data is abnormally high    Thyroid Ultrasound 04/25/2020 Estimated total number of nodules >/= 1 cm: 1   Number of  spongiform nodules >/=  2 cm not described below (TR1): 0   Number of mixed cystic and solid nodules >/= 1.5 cm not described below (TR2): 0   _________________________________________________________   Nodule labeled 1 superior right thyroid, 6 mm. This nodule does not meet criteria for surveillance or biopsy.  Nodule labeled 2 in the mid right thyroid, 1.9 cm, previously 1.7 cm. This remains TR 3 and meets criteria for continued surveillance.   No adenopathy   IMPRESSION: Right mid thyroid nodule (labeled 2, TR 3, 1.9 cm) meets criteria for surveillance, as designated by the newly established ACR TI-RADS criteria. Surveillance ultrasound study recommended to be performed annually up to 5 years.    ASSESSMENT / PLAN / RECOMMENDATIONS:   1. Right thyroid nodule:  - Pt denies any local neck symptoms  - Thyroid ultrasound had shown stable thyroid nodule in the past, will repeat this year, this was not done last year  2. Hypothyroidism:  - Pt is clinically euthyroid , but TSH elevated  - Will increase as below    Medication Stop Levothyroxine 112 mcg daily  Start Levothyroxine 125 mcg daily   3.  Hyperparathyroidism :  -Historically she has had elevated serum calcium, the ones with a concomitant albumin after correction were normal. - We emphasized the importance of vitamin d and calcium intake on daily basis, we emphasized the importance of these to bone health  -Emphasized the importance of taking vitamin D 1000 iu daily and consume 2-3 servings of dietary calcium daily  - She has had hx of elevated serum calcium but normal ionized calcium  -PTH is normal today  F/U in 6 months    Signed electronically by: Lyndle Herrlich, MD  [facility] Endocrinology  [facility] [address] [place], [place] [address] Phone: [phone] FAX: [phone]      CC: Venna, Berberich, NP [address] [place]  [address] Phone: [phone]  Fax: [phone]   Return to Endocrinology clinic as below: No future appointments.

## 2022-06-15 LAB — BASIC METABOLIC PANEL
BUN: 20 mg/dL (ref 6–23)
CO2: 30 mEq/L (ref 19–32)
Calcium: 10.3 mg/dL (ref 8.4–10.5)
Chloride: 102 mEq/L (ref 96–112)
Creatinine, Ser: 0.87 mg/dL (ref 0.40–1.20)
GFR: 73.57 mL/min (ref >60.00)
Glucose, Bld: 84 mg/dL (ref 70–99)
Potassium: 4.5 mEq/L (ref 3.5–5.1)
Sodium: 139 mEq/L (ref 135–145)

## 2022-06-15 LAB — ALBUMIN: Albumin: 4.5 g/dL (ref 3.5–5.2)

## 2022-06-15 LAB — TSH: TSH: 7.51 u[IU]/mL — ABNORMAL HIGH (ref 0.35–5.50)

## 2022-06-15 LAB — VITAMIN D 25 HYDROXY (VIT D DEFICIENCY, FRACTURES): VITD: 26.29 ng/mL — ABNORMAL LOW (ref 30.00–100.00)

## 2022-06-15 MED ORDER — LEVOTHYROXINE SODIUM 125 MCG PO TABS: 125.0000 ug | ORAL_TABLET | Freq: Every day | ORAL | 3 refills | Status: DC

## 2022-06-16 LAB — BASIC METABOLIC PANEL WITH GFR
BUN: 20 mg/dL (ref 7–25)
CO2: 26 mmol/L (ref 20–32)
Calcium: 10 mg/dL (ref 8.6–10.4)
Chloride: 101 mmol/L (ref 98–110)
Creat: 0.83 mg/dL (ref 0.50–1.03)
Glucose, Bld: 81 mg/dL (ref 65–99)
Potassium: 4.7 mmol/L (ref 3.5–5.3)
Sodium: 138 mmol/L (ref 135–146)
eGFR: 82 mL/min/{1.73_m2} (ref >=60)

## 2022-06-16 LAB — PARATHYROID HORMONE, INTACT (NO CA): PTH: 55 pg/mL (ref 16–77)

## 2022-06-16 LAB — CALCIUM, IONIZED: Calcium, Ion: 5.4 mg/dL (ref 4.7–5.5)

## 2022-06-21 ENCOUNTER — Ambulatory Visit (HOSPITAL_BASED_OUTPATIENT_CLINIC_OR_DEPARTMENT_OTHER): Payer: BC Managed Care – PPO

## 2022-06-23 ENCOUNTER — Telehealth: Payer: Self-pay | Admitting: Family

## 2022-06-23 MED ORDER — ATORVASTATIN CALCIUM 20 MG PO TABS: 20.0000 mg | ORAL_TABLET | Freq: Every day | ORAL | 0 refills | Status: DC

## 2022-06-23 NOTE — Telephone Encounter (Signed)
Rx filled and left message on machine that medication has been sent in and that she needs to call back to schedule cpe for anytime after 07/24/22

## 2022-06-23 NOTE — Telephone Encounter (Signed)
Patient has new pharmacy.   Medication: atorvastatin (LIPITOR) 20 MG tablet   Has the patient contacted their pharmacy? No.  New Pharmacy   Preferred Pharmacy (with phone number or street name):  [address] [place] [place], [place] [address] // [phone]  Agent: Please be advised that RX refills may take up to 3 business days. We ask that you follow-up with your pharmacy.

## 2022-07-08 ENCOUNTER — Ambulatory Visit (HOSPITAL_BASED_OUTPATIENT_CLINIC_OR_DEPARTMENT_OTHER)
Admission: RE | Admit: 2022-07-08 | Discharge: 2022-07-08 | Disposition: A | Discharge status: Home / Self Care | Payer: BC Managed Care – PPO | Source: Ambulatory Visit | Attending: Internal Medicine | Admitting: Internal Medicine

## 2022-07-08 DIAGNOSIS — E041 Nontoxic single thyroid nodule: Secondary | ICD-10-CM | POA: Diagnosis not present

## 2022-07-13 ENCOUNTER — Telehealth: Payer: Self-pay

## 2022-07-13 NOTE — Telephone Encounter (Signed)
I called and LM to return my call regarding [employer] request for medical records

## 2022-07-14 NOTE — Telephone Encounter (Signed)
Patient returned CMA's call. 

## 2022-07-14 NOTE — Telephone Encounter (Addendum)
Spoke with patient, will fax consent letter for [employer]/ [place].

## 2022-07-20 ENCOUNTER — Other Ambulatory Visit: Payer: Self-pay | Admitting: Family

## 2022-07-20 DIAGNOSIS — Z1231 Encounter for screening mammogram for malignant neoplasm of breast: Secondary | ICD-10-CM

## 2022-07-21 ENCOUNTER — Telehealth: Payer: Self-pay | Admitting: Family

## 2022-07-21 ENCOUNTER — Other Ambulatory Visit: Payer: Self-pay

## 2022-07-21 NOTE — Telephone Encounter (Signed)
Please contact pt to schedule cpx.  

## 2022-07-22 ENCOUNTER — Telehealth: Payer: Self-pay | Admitting: Cardiology

## 2022-07-22 NOTE — Telephone Encounter (Signed)
[employer] called stating they need documents they faxed over, signed and faxed back over to them at Dr. Marthann Schiller earlier convenience.    She stated (responding to the last phone note) they are not requesting medical records.

## 2022-07-22 NOTE — Telephone Encounter (Signed)
Patient is aware [employer] is requesting documents however, I still have not received the consent we spoke about on 07/13/22. I've gave my contact information again , she is working it.

## 2022-07-23 NOTE — Telephone Encounter (Signed)
Spoke to the patient, aware will need a visit to complete life insurance assessment.  She is schedule on 07/29/22, forms in Pod A

## 2022-07-29 ENCOUNTER — Ambulatory Visit: Payer: BC Managed Care – PPO | Attending: Cardiology | Admitting: Cardiology

## 2022-07-29 ENCOUNTER — Encounter: Payer: Self-pay | Admitting: Cardiology

## 2022-07-29 VITALS — BP 134/84 | HR 72 | Ht 64.0 in | Wt 184.0 lb

## 2022-07-29 DIAGNOSIS — I10 Essential (primary) hypertension: Secondary | ICD-10-CM

## 2022-07-29 DIAGNOSIS — I2510 Atherosclerotic heart disease of native coronary artery without angina pectoris: Secondary | ICD-10-CM

## 2022-07-29 DIAGNOSIS — E782 Mixed hyperlipidemia: Secondary | ICD-10-CM

## 2022-07-29 NOTE — Addendum Note (Signed)
Addended by: Jacobo Forest D on: 07/29/2022 09:09 AM   Modules accepted: Orders

## 2022-07-29 NOTE — Progress Notes (Signed)
Cardiology Office Note:    Date:  07/29/2022   ID:  Melissa Gibson, [name] June 04, 1966, MRN [id number]  PCP:  [name], NP  Cardiologist:  Jenne Campus, MD    Referring MD: [name], NP   Chief Complaint  Patient presents with   Follow-up    History of Present Illness:    Melissa Gibson is a 57 y.o. 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  Her father got myocardial infarction at an early age.  She was referred to [place] initially for evaluation for CAD.  She was completely asymptomatic and was doing quite well.  However this time she required some life insurance form being filled up and she is back in my office.  Overall she is doing fine denies having any typical symptoms.  Past described to have some shortness of breath while exercises.  Denies having any dizziness passing out slight welling of lower extremities overall doing well.  Recently she changed jobs and she is very happy and satisfied with new job.  Past Medical History:  Diagnosis Date   Abnormal Pap smear of cervix    -age 54/unsure if she had any treatment/03-03-15 LSIL:Pos HR HPV;LEEP 04-14-15 LGSIL w/neg margins   CIN II (cervical intraepithelial neoplasia II) 03/05/2016   Constipation 10/07/2020   COVID 01/12/2021   HYPERLIPIDEMIA 10/08/2008   Qualifier: Diagnosis of  By: Ronnald Ramp MD, Arvid Right.    Hyperparathyroidism ([place]) 10/22/2019   Hypothyroidism 10/08/2008   Qualifier: Diagnosis of  By: Ronnald Ramp MD, Arvid Right.    Lateral epicondylitis  of elbow 12/27/2011   Melasma 12/27/2011   Rectal bleeding 10/07/2020   Right thyroid nodule 04/08/2020   Secondary hyperparathyroidism, non-renal ([place]) 04/10/2020   Thyroid nodule 05/19/2017    Past Surgical History:  Procedure Laterality Date   COLONOSCOPY W/ BIOPSIES   07/10/2014   Diverticulosis of large intestines and hyperplastic ployp X 1 recheck in 10 yrs.   LEEP  04/14/2015   CIN I/2   NOVASURE ABLATION  11/03/2010   TUBAL LIGATION  07/05/2004    Current Medications: Current Meds  Medication Sig   amLODipine (NORVASC) 2.5 MG tablet Take 1 tablet (2.5 mg total) by mouth daily.   atorvastatin (LIPITOR) 20 MG tablet Take 1 tablet (20 mg total) by mouth daily.   levothyroxine (SYNTHROID) 125 MCG tablet Take 1 tablet (125 mcg total) by mouth daily.     Allergies:   Patient has no known allergies.   Social History   Socioeconomic History   Marital status: Married    Spouse name: Not on file   Number of children: Not on file   Years of education: Not on file   Highest education level: Not on file  Occupational History   Occupation: SALES  Tobacco Use   Smoking status: Former    Packs/day: 1.00    Years: 30.00    Total pack years: 30.00    Types: Cigarettes    Quit date: 07/05/2008    Years since quitting: 14.0   Smokeless tobacco: Never   Tobacco comments:    quit in 2010  Substance and Sexual Activity   Alcohol use: Yes    Alcohol/week: 1.0 - 2.0 standard drink of alcohol    Types: 1 - 2 Standard drinks or equivalent per week   Drug use: No   Sexual activity: Yes  Partners: Male    Birth control/protection: Surgical    Comment: BTL, Ablation  Other Topics Concern   Not on file  Social History Narrative   Regular Exercise    Former smoker   internet sales   Married   No children1 year of college   Enjoys crafts, Haematologist.   Social Determinants of Health   Financial Resource Strain: Not on file  Food Insecurity: Not on file  Transportation Needs: Not on file  Physical Activity: Not on file  Stress: Not on file  Social Connections: Not on file     Family History: The patient's family history includes Arthritis in her maternal grandfather; COPD in her brother and mother; Cancer in her maternal grandmother and sister;  Cirrhosis in her brother; Heart attack in her father; Heart attack (age of onset: 28) in her brother; Heart failure in her father; Hyperlipidemia in her father and mother; Hypertension in her father; Infertility in her sister; Lung cancer (age of onset: 66) in her sister; Thyroid disease in her sister and sister. There is no history of Breast cancer or Hypercalcemia. ROS:   Please see the history of present illness.    All 14 point review of systems negative except as described per history of present illness  EKGs/Labs/Other Studies Reviewed:      Recent Labs: 06/14/2022: BUN 20; BUN 20; Creat 0.83; Creatinine, Ser 0.87; Potassium 4.5; Potassium 4.7; Sodium 139; Sodium 138; TSH 7.51  Recent Lipid Panel    Component Value Date/Time   CHOL 172 07/24/2021 1406   CHOL 227 (H) 04/02/2019 0926   TRIG 97 07/24/2021 1406   HDL 62 07/24/2021 1406   HDL 46 04/02/2019 0926   CHOLHDL 2.8 07/24/2021 1406   VLDL 24.2 06/23/2020 0815   LDLCALC 91 07/24/2021 1406   LDLDIRECT 125.0 01/29/2010 0906    Physical Exam:    VS:  BP 134/84 (BP Location: Left Arm, Patient Position: Sitting)   Pulse 72   Ht 5\' 4"  (1.626 m)   Wt 184 lb (83.5 kg)   SpO2 95%   BMI 31.58 kg/m     Wt Readings from Last 3 Encounters:  07/29/22 184 lb (83.5 kg)  06/14/22 182 lb (82.6 kg)  11/12/21 177 lb 12.8 oz (80.6 kg)     GEN:  Well nourished, well developed in no acute distress HEENT: Normal NECK: No JVD; No carotid bruits LYMPHATICS: No lymphadenopathy CARDIAC: RRR, no murmurs, no rubs, no gallops RESPIRATORY:  Clear to auscultation without rales, wheezing or rhonchi  ABDOMEN: Soft, non-tender, non-distended MUSCULOSKELETAL:  No edema; No deformity  SKIN: Warm and dry LOWER EXTREMITIES: no swelling NEUROLOGIC:  Alert and oriented x 3 PSYCHIATRIC:  Normal affect   ASSESSMENT:    1. Coronary artery disease involving native coronary artery of native heart without angina pectoris   2. Essential hypertension    3. Mixed hyperlipidemia    PLAN:    In order of problems listed above:  Coronary disease she does have calcification of the circumflex artery.  I will schedule her to have plan EKG treadmill stress test to get assessment of her physical abilities as well as make sure she does not have any inducible ischemia.  In the meantime I asked her to start taking antiplatelet therapy which will be aspirin 81 mg daily.  We did talk about need to exercise on the regular basis and good diet. Essential hypertension blood pressure seems to well-controlled today continue present management. Dyslipidemia I did review her  K PN which show me his LDL 91 HDL 61 this is from July 24, 2021.  Will get another fasting lipid profile to see if we need to augment her therapy.   Medication Adjustments/Labs and Tests Ordered: Current medicines are reviewed at length with the patient today.  Concerns regarding medicines are outlined above.  No orders of the defined types were placed in this encounter.  Medication changes: No orders of the defined types were placed in this encounter.   Signed, Georgeanna Lea, MD, [facility] 07/29/2022 9:00 AM    [facility]

## 2022-07-29 NOTE — Patient Instructions (Addendum)
Medication Instructions:  Your physician recommends that you continue on your current medications as directed. Please refer to the Current Medication list given to you today.  *If you need a refill on your cardiac medications before your next appointment, please call your pharmacy*   Lab Work: Your physician recommends that you return for lab work in: when fasting You need to have labs done when you are fasting.  You can come Monday through Friday 8:30 am to 12:00 pm and 1:15 to 4:30. You do not need to make an appointment as the order has already been placed. The labs you are going to have done are Lipids.    Testing/Procedures:  Exercise Stress Test: Light Breakfast and Dress comfortable with walking shoes    Follow-Up: At [facility], you and your health needs are our priority.  As part of our continuing mission to provide you with exceptional heart care, we have created designated Provider Care Teams.  These Care Teams include your primary Cardiologist (physician) and Advanced Practice Providers (APPs -  Physician Assistants and Nurse Practitioners) who all work together to provide you with the care you need, when you need it.  We recommend signing up for the patient portal called "MyChart".  Sign up information is provided on this After Visit Summary.  MyChart is used to connect with patients for Virtual Visits (Telemedicine).  Patients are able to view lab/test results, encounter notes, upcoming appointments, etc.  Non-urgent messages can be sent to your provider as well.   To learn more about what you can do with MyChart, go to [email or web address].    Your next appointment:   12 month(s)  The format for your next appointment:   In Person  Provider:   Jenne Campus, MD    Other Instructions NA

## 2022-08-03 ENCOUNTER — Ambulatory Visit
Admission: RE | Admit: 2022-08-03 | Discharge: 2022-08-03 | Disposition: A | Discharge status: Home / Self Care | Payer: BC Managed Care – PPO | Source: Ambulatory Visit | Attending: Family | Admitting: Family

## 2022-08-03 DIAGNOSIS — Z1231 Encounter for screening mammogram for malignant neoplasm of breast: Secondary | ICD-10-CM

## 2022-08-04 ENCOUNTER — Telehealth (HOSPITAL_COMMUNITY): Payer: Self-pay | Admitting: *Deleted

## 2022-08-04 NOTE — Telephone Encounter (Signed)
Pt reached and given instructions for ETT on 08/06/22.

## 2022-08-06 ENCOUNTER — Ambulatory Visit (HOSPITAL_COMMUNITY): Payer: BC Managed Care – PPO | Attending: Cardiology

## 2022-08-06 DIAGNOSIS — I2510 Atherosclerotic heart disease of native coronary artery without angina pectoris: Secondary | ICD-10-CM | POA: Diagnosis not present

## 2022-08-07 LAB — EXERCISE TOLERANCE TEST
Angina Index: 0
Duke Treadmill Score: 7
Estimated workload: 8.5
Exercise duration (min): 7 min
Exercise duration (sec): 0 s
MPHR: 162 {beats}/min
Peak HR: 155 {beats}/min
Percent HR: 95 %
Rest HR: 71 {beats}/min
ST Depression (mm): 0 mm

## 2022-08-13 ENCOUNTER — Encounter: Payer: Self-pay | Admitting: Family

## 2022-08-13 ENCOUNTER — Ambulatory Visit (INDEPENDENT_AMBULATORY_CARE_PROVIDER_SITE_OTHER): Payer: BC Managed Care – PPO | Admitting: Family

## 2022-08-13 VITALS — BP 118/65 | HR 74 | Temp 98.2°F | Resp 18 | Ht 64.0 in | Wt 183.0 lb

## 2022-08-13 DIAGNOSIS — M8580 Other specified disorders of bone density and structure, unspecified site: Secondary | ICD-10-CM

## 2022-08-13 DIAGNOSIS — E039 Hypothyroidism, unspecified: Secondary | ICD-10-CM

## 2022-08-13 DIAGNOSIS — Z0000 Encounter for general adult medical examination without abnormal findings: Secondary | ICD-10-CM

## 2022-08-13 DIAGNOSIS — I10 Essential (primary) hypertension: Secondary | ICD-10-CM | POA: Diagnosis not present

## 2022-08-13 DIAGNOSIS — E785 Hyperlipidemia, unspecified: Secondary | ICD-10-CM | POA: Diagnosis not present

## 2022-08-13 LAB — COMPREHENSIVE METABOLIC PANEL
ALT: 28 U/L (ref 0–35)
AST: 21 U/L (ref 0–37)
Albumin: 4.1 g/dL (ref 3.5–5.2)
Alkaline Phosphatase: 86 U/L (ref 39–117)
BUN: 18 mg/dL (ref 6–23)
CO2: 28 mEq/L (ref 19–32)
Calcium: 9.6 mg/dL (ref 8.4–10.5)
Chloride: 102 mEq/L (ref 96–112)
Creatinine, Ser: 0.89 mg/dL (ref 0.40–1.20)
GFR: 71.51 mL/min (ref >60.00)
Glucose, Bld: 92 mg/dL (ref 70–99)
Potassium: 4.8 mEq/L (ref 3.5–5.1)
Sodium: 138 mEq/L (ref 135–145)
Total Bilirubin: 0.9 mg/dL (ref 0.2–1.2)
Total Protein: 6.5 g/dL (ref 6.0–8.3)

## 2022-08-13 LAB — LIPID PANEL
Cholesterol: 161 mg/dL (ref 0–200)
HDL: 58.9 mg/dL (ref >39.00)
LDL Cholesterol: 88 mg/dL (ref 0–99)
NonHDL: 102.16
Total CHOL/HDL Ratio: 3
Triglycerides: 70 mg/dL (ref 0.0–149.0)
VLDL: 14 mg/dL (ref 0.0–40.0)

## 2022-08-13 LAB — TSH: TSH: 0.29 u[IU]/mL — ABNORMAL LOW (ref 0.35–5.50)

## 2022-08-13 NOTE — Assessment & Plan Note (Signed)
Last TSH was elevated. Will repeat TSH, continue synthroid.

## 2022-08-13 NOTE — Assessment & Plan Note (Signed)
Lab Results  Component Value Date   CHOL 172 07/24/2021   HDL 62 07/24/2021   LDLCALC 91 07/24/2021   LDLDIRECT 125.0 01/29/2010   TRIG 97 07/24/2021   CHOLHDL 2.8 07/24/2021   Obtain follow up lipid panel per cardiology request.  Continue atorvastatin.

## 2022-08-13 NOTE — Assessment & Plan Note (Signed)
Wt Readings from Last 3 Encounters:  08/13/22 183 lb (83 kg)  08/06/22 186 lb (84.4 kg)  07/29/22 184 lb (83.5 kg)   Continue healthy diet, exercise, weight loss efforts. Pap/mammo up to date. Will update dexa.  Encouraged her to get the covid booster at her pharmacy.

## 2022-08-13 NOTE — Assessment & Plan Note (Signed)
Initial bp reading high today upon arrival. Repeat BP WNL. Continue current dose of amlodipine.

## 2022-08-13 NOTE — Progress Notes (Signed)
Subjective:   By signing my name below, I, Melissa Gibson, attest that this documentation has been prepared under the direction and in the presence of Melissa Alar, NP. 08/13/2022.   Patient ID: Melissa Gibson, female    DOB: Nov 11, 1965, 57 y.o.   MRN: [id number]  Chief Complaint  Patient presents with   Annual Exam    HPI Patient is in today for a comprehensive physical exam.  Levothyroxine: Her levothyroxine was adjusted to 125 mcg daily back in December 2023. She has refilled this recently.  Blood Pressure:  Initially her blood pressure was elevated at 140/62. On manual recheck her BP improved to 118/65. BP Readings from Last 3 Encounters:  08/13/22 118/65  07/29/22 134/84  06/14/22 134/76   Aches and Pains: She denies any new myalgias. She endorses chronic, standard pain due to her age.  Social history:  No new surgeries in the past year. 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  Colonoscopy: Last completed 07/08/2014.  Dexa: Last completed 10/08/2019 showing mild bone thinning.   Pap Smear: Last completed 11/12/2021.  Mammogram:  Last completed 08/03/2022.  Immunizations: COVID vaccine last received 06/04/2020, she confirms not receiving a booster last Fall. Tdap last received 07/24/2021. Influenza and Shingrix immunizations are UTD.  Diet/Exercise:  In the past 6 months she has gained some weight. Prior to that, she states she was losing weight gradually. She would attribute her weight change to her TSH levels since she denies any significant changes in her diet or exercise.   Dental/Vision:  She states dental and vision care is up to date.  Blood Work: She presents a Psychologist, forensic order from Dr. Agustin Cree which we will add to our lab orders today.  Denies having any fever, new muscle pain, joint pain, new moles, congestion, sinus pain, sore throat, chest pain, palpitations, cough, SOB, wheezing, n/v/d, constipation, blood in stool, dysuria, frequency,  hematuria, at this time.  Past Medical History:  Diagnosis Date   Abnormal Pap smear of cervix    -age 62/unsure if she had any treatment/03-03-15 LSIL:Pos HR HPV;LEEP 04-14-15 LGSIL w/neg margins   CIN II (cervical intraepithelial neoplasia II) 03/05/2016   Constipation 10/07/2020   COVID 01/12/2021   HYPERLIPIDEMIA 10/08/2008   Qualifier: Diagnosis of  By: Ronnald Ramp MD, Arvid Right.    Hyperparathyroidism ([facility]) 10/22/2019   Hypothyroidism 10/08/2008   Qualifier: Diagnosis of  By: Ronnald Ramp MD, Arvid Right.    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   LEEP  04/14/2015   CIN I/2   NOVASURE ABLATION  11/03/2010   TUBAL LIGATION  07/05/2004    Family History  Problem Relation Age of Onset   COPD Mother        on hospice   Hyperlipidemia Mother    Heart attack Father    Hypertension Father    Heart failure Father    Hyperlipidemia Father    Lung cancer Sister 76       lung cancer   Infertility Sister    Thyroid disease Sister    Cancer Sister        lung; died at 34yr  Thyroid disease Sister        hypothyroid   Heart attack Brother 52   COPD Brother    Cirrhosis Brother        caused his death, ETOH abuse   Cancer Maternal Grandmother        lymph nodes   Arthritis Maternal Grandfather    Breast cancer Neg Hx    Hypercalcemia Neg Hx     Social History   Socioeconomic History   Marital status: Married    Spouse name: Not on file   Number of children: Not on file   Years of education: Not on file   Highest education level: Not on file  Occupational History   Occupation: SALES  Tobacco Use   Smoking status: Former     Packs/day: 1.00    Years: 30.00    Total pack years: 30.00    Types: Cigarettes    Quit date: 07/05/2008    Years since quitting: 14.1   Smokeless tobacco: Never   Tobacco comments:    quit in 2010  Substance and Sexual Activity   Alcohol use: Yes    Alcohol/week: 1.0 - 2.0 standard drink of alcohol    Types: 1 - 2 Standard drinks or equivalent per week   Drug use: No   Sexual activity: Yes    Partners: Male    Birth control/protection: Surgical    Comment: BTL, Ablation  Other Topics Concern   Not on file  Social History Narrative   Regular Exercise    Former smoker   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   Married   No children   1 year of college   Enjoys crafts, Haematologist.   Social Determinants of Health   Financial Resource Strain: Not on file  Food Insecurity: Not on file  Transportation Needs: Not on file  Physical Activity: Not on file  Stress: Not on file  Social Connections: Not on file  Intimate Partner Violence: Not on file    Outpatient Medications Prior to Visit  Medication Sig Dispense Refill   amLODipine (NORVASC) 2.5 MG tablet Take 1 tablet (2.5 mg total) by mouth daily. 90 tablet 1   atorvastatin (LIPITOR) 20 MG tablet Take 1 tablet (20 mg total) by mouth daily. 90 tablet 0   levothyroxine (SYNTHROID) 125 MCG tablet Take 1 tablet (125 mcg total) by mouth daily. 90 tablet 3   No facility-administered medications prior to visit.    No Known Allergies  Review of Systems  Constitutional:  Negative for fever.  HENT:  Negative for congestion, sinus pain and sore throat.   Respiratory:  Negative for cough, shortness of breath and wheezing.   Cardiovascular:  Negative for chest pain and palpitations.  Gastrointestinal:  Negative for blood in stool, constipation, diarrhea, nausea and vomiting.  Genitourinary:  Negative for dysuria, frequency and hematuria.  Musculoskeletal:  Negative for joint pain and myalgias.       Objective:    Physical  Exam Constitutional:      Appearance: Normal appearance.  HENT:     Head: Normocephalic and atraumatic.     Right Ear: Tympanic membrane, ear canal and external ear normal.     Left Ear: Tympanic membrane, ear canal and external ear normal.  Eyes:     Extraocular Movements: Extraocular movements intact.     Pupils: Pupils are equal, round, and reactive to light.  Cardiovascular:     Rate and Rhythm: Normal rate and regular rhythm.  Heart sounds: Normal heart sounds. No murmur heard.    No gallop.  Pulmonary:     Effort: Pulmonary effort is normal. No respiratory distress.     Breath sounds: Normal breath sounds. No wheezing or rales.  Abdominal:     General: Bowel sounds are normal. There is no distension.     Palpations: Abdomen is soft.     Tenderness: There is no abdominal tenderness. There is no guarding.  Musculoskeletal:        General: Normal range of motion.  Skin:    General: Skin is warm and dry.  Neurological:     General: No focal deficit present.     Mental Status: She is alert and oriented to person, place, and time.     Deep Tendon Reflexes:     Reflex Scores:      Patellar reflexes are 2+ on the right side and 2+ on the left side. Psychiatric:        Mood and Affect: Mood normal.        Behavior: Behavior normal.     BP 118/65   Pulse 74   Temp 98.2 F (36.8 C)   Resp 18   Ht 5' 4"$  (1.626 m)   Wt 183 lb (83 kg)   SpO2 100%   BMI 31.41 kg/m  Wt Readings from Last 3 Encounters:  08/13/22 183 lb (83 kg)  08/06/22 186 lb (84.4 kg)  07/29/22 184 lb (83.5 kg)      Assessment & Plan:   Problem List Items Addressed This Visit       Unprioritized   Preventative health care    Wt Readings from Last 3 Encounters:  08/13/22 183 lb (83 kg)  08/06/22 186 lb (84.4 kg)  07/29/22 184 lb (83.5 kg)  Continue healthy diet, exercise, weight loss efforts. Pap/mammo up to date. Will update dexa.  Encouraged her to get the covid booster at her pharmacy.        Osteopenia   Relevant Orders   DG Bone Density   Hypothyroidism - Primary    Last TSH was elevated. Will repeat TSH, continue synthroid.       Relevant Orders   TSH   Hyperlipidemia    Lab Results  Component Value Date   CHOL 172 07/24/2021   HDL 62 07/24/2021   LDLCALC 91 07/24/2021   LDLDIRECT 125.0 01/29/2010   TRIG 97 07/24/2021   CHOLHDL 2.8 07/24/2021  Obtain follow up lipid panel per cardiology request.  Continue atorvastatin.      Relevant Orders   Lipid panel   Essential hypertension    Initial bp reading high today upon arrival. Repeat BP WNL. Continue current dose of amlodipine.       Relevant Orders   Comp Met (CMET)     No orders of the defined types were placed in this encounter.   I, Nance Pear, NP, personally preformed the services described in this documentation.  All medical record entries made by the scribe were at my direction and in my presence.  I have reviewed the chart and discharge instructions (if applicable) and agree that the record reflects my personal performance and is accurate and complete. 08/13/2022.  I,Mathew Stumpf,acting as a Education administrator for [employer], NP.,have documented all relevant documentation on the behalf of HUDA TINGEN, NP,as directed by  Nance Pear, NP while in the presence of Nance Pear, NP.   Nance Pear, NP

## 2022-08-16 ENCOUNTER — Telehealth: Payer: Self-pay | Admitting: Internal Medicine

## 2022-08-16 DIAGNOSIS — E039 Hypothyroidism, unspecified: Secondary | ICD-10-CM

## 2022-08-16 NOTE — Telephone Encounter (Signed)
Please let the patient know that the current dose of levothyroxine 125 mcg daily is too much for her.  Please asked the patient to take half a tablet of levothyroxine on Sundays, and 1 tablet the rest of the week   Please schedule her for a lab appointment 2 months out   Thanks

## 2022-08-16 NOTE — Telephone Encounter (Signed)
Mychart message sent to pt with results and recommendation.

## 2022-08-17 ENCOUNTER — Telehealth: Payer: Self-pay | Admitting: Cardiology

## 2022-08-17 NOTE — Telephone Encounter (Signed)
[employer] is calling to check on the status of a questionnaire form they faxed to the [place] office on 12/20 and 01/11. They are wanting to confirm it was received as well as the turnaround time to receive the forms back. Please advise.

## 2022-08-17 NOTE — Telephone Encounter (Signed)
Returned call to [employer] and left message that the form they are requesting has not been received and asked to re fax the form.

## 2022-08-18 ENCOUNTER — Encounter: Payer: Self-pay | Admitting: Cardiology

## 2022-08-19 NOTE — Telephone Encounter (Signed)
Caller is following up insurance forms which will need to be completed and faxed back.

## 2022-08-19 NOTE — Telephone Encounter (Signed)
Left message for Melissa Gibson that forms are waiting to be signed and will be faxed back soon.

## 2022-08-20 ENCOUNTER — Other Ambulatory Visit: Payer: Self-pay

## 2022-08-20 DIAGNOSIS — E213 Hyperparathyroidism, unspecified: Secondary | ICD-10-CM

## 2022-08-20 MED ORDER — LEVOTHYROXINE SODIUM 125 MCG PO TABS: 125.0000 ug | ORAL_TABLET | Freq: Every day | ORAL | 3 refills | Status: DC

## 2022-09-09 IMAGING — CT CT CHEST LUNG CANCER SCREENING LOW DOSE W/O CM
2 of 3 series · 15 of 36 positions shown, 18 images · non-contrast
Comparison: Low-dose lung cancer screening chest CT 08/20/2019.

CLINICAL DATA: 54-year-old female former smoker (quit 11 years ago)
with 30 pack-year history of smoking. Lung cancer screening
examination.

EXAM:
CT CHEST WITHOUT CONTRAST LOW-DOSE FOR LUNG CANCER SCREENING
TECHNIQUE: Multidetector CT imaging of the chest was performed following the
standard protocol without IV contrast.

[Series 2: axial st · axial · 0.71mm/px · z∈[+706,+961]mm · 12 of 61 slices shown, 15 images]
[im 5/61  mediastinal]
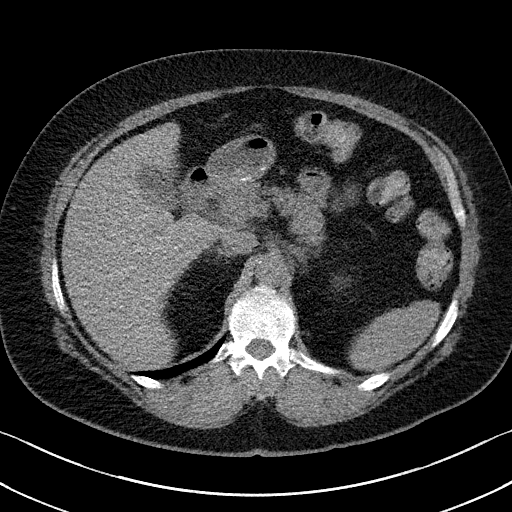
[im 5/61  lung]
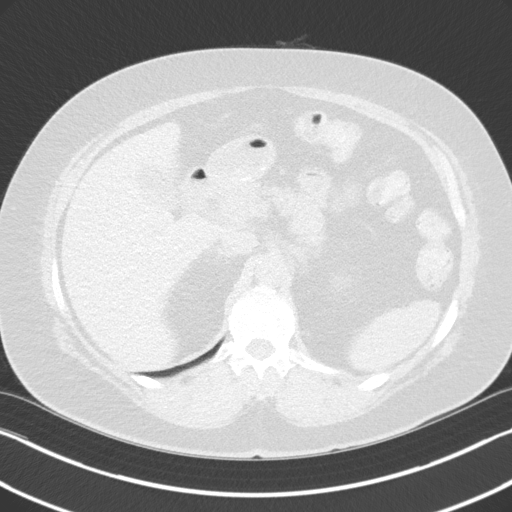
[im 9/61  lung]
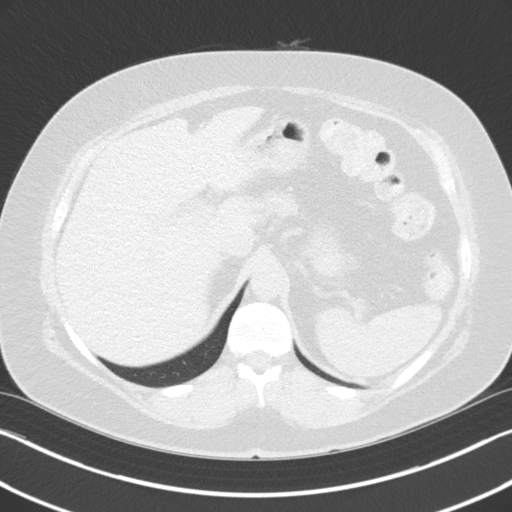
[im 14/61  lung]
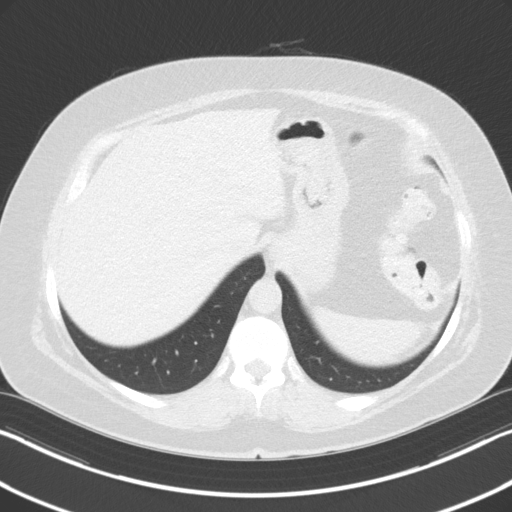
[im 18/61  lung]
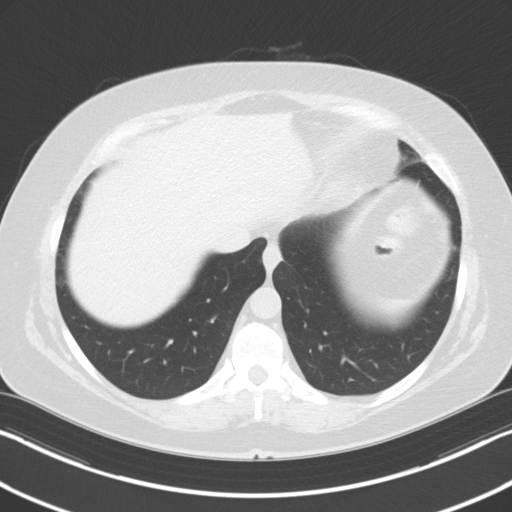
[im 23/61  mediastinal]
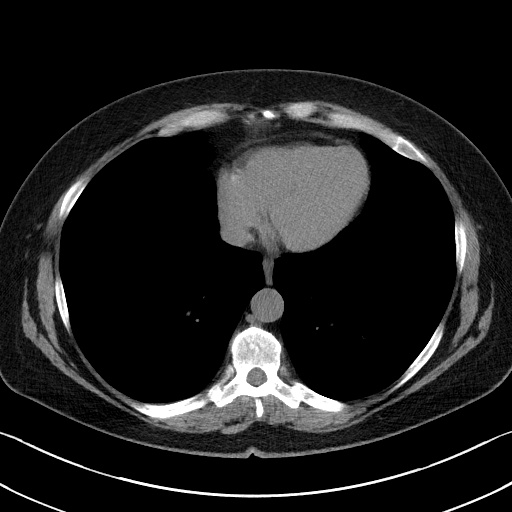
[im 23/61  lung]
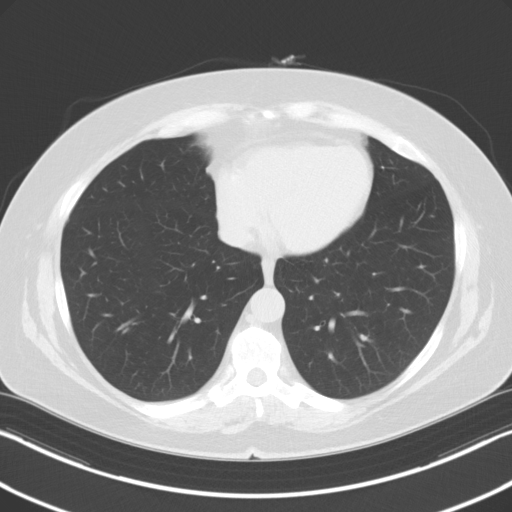
[im 27/61  lung]
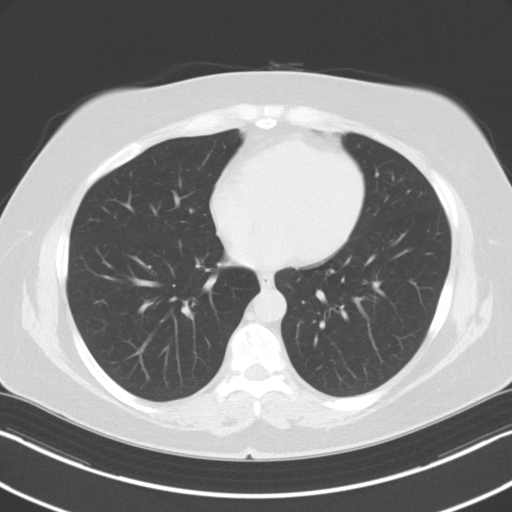
[im 34/61  lung]
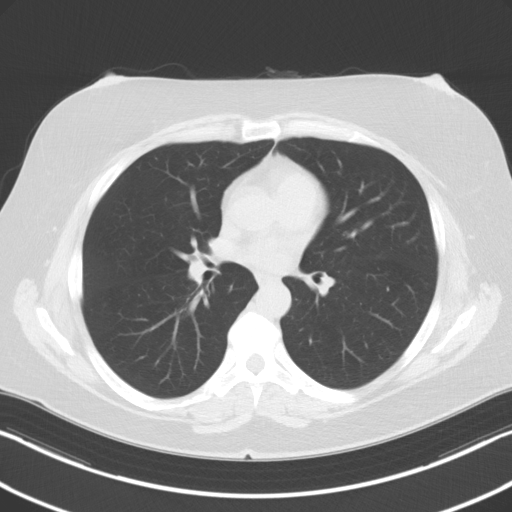
[im 38/61  lung]
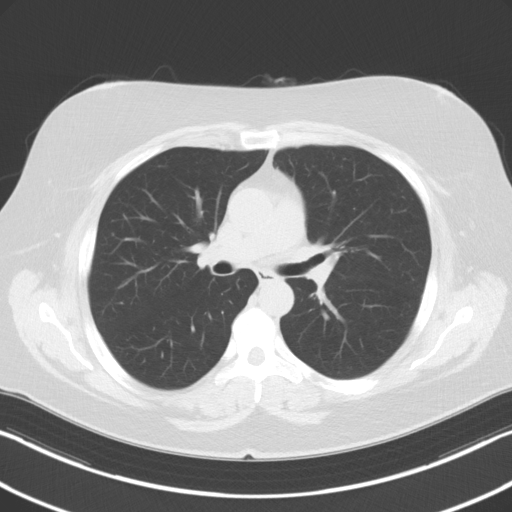
[im 43/61  mediastinal]
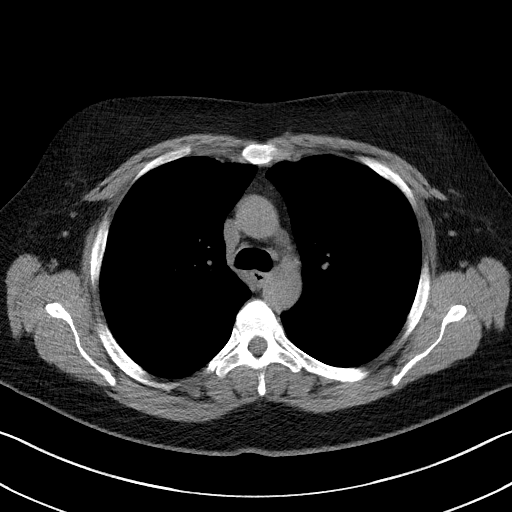
[im 43/61  lung]
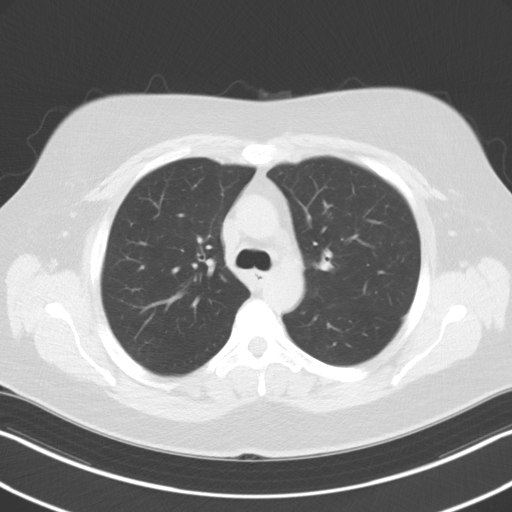
[im 47/61  lung]
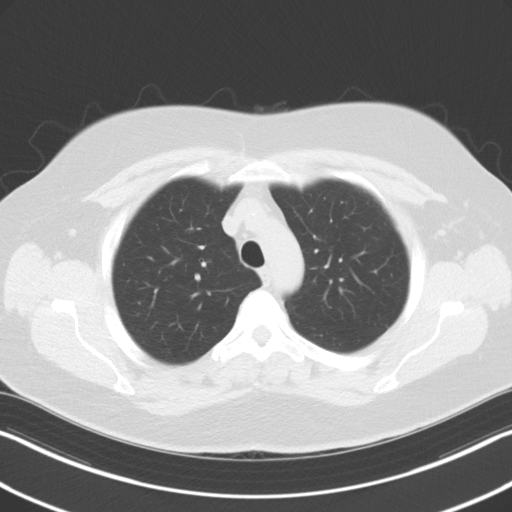
[im 52/61  lung]
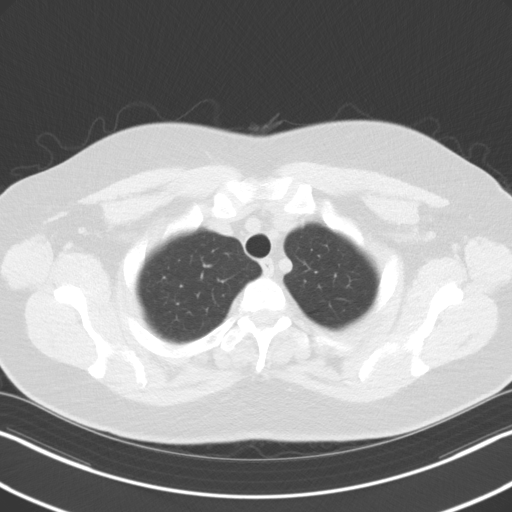
[im 56/61  lung]
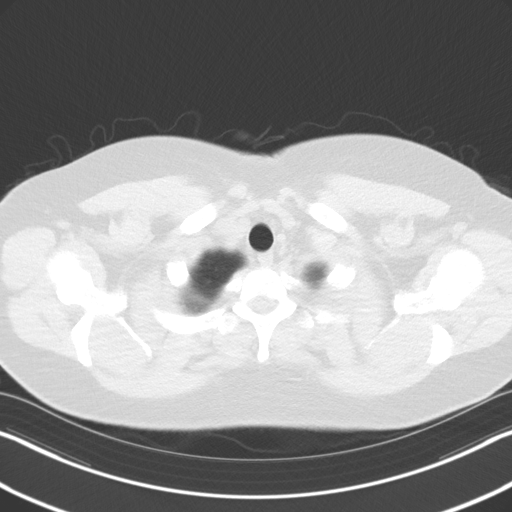

[Series 5: coronal · coronal · 0.63mm/px · 3 of 300 slices shown]
[im 60/300  lung]
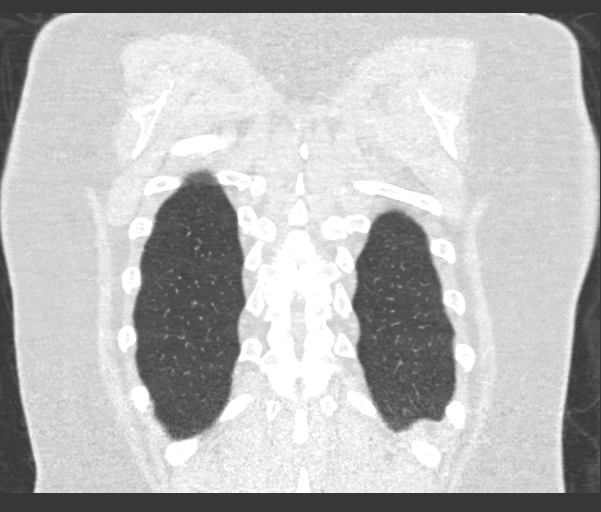
[im 120/300  lung]
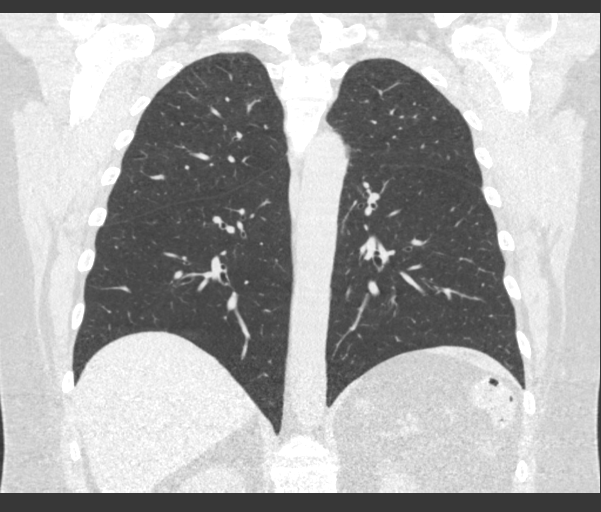
[im 180/300  lung]
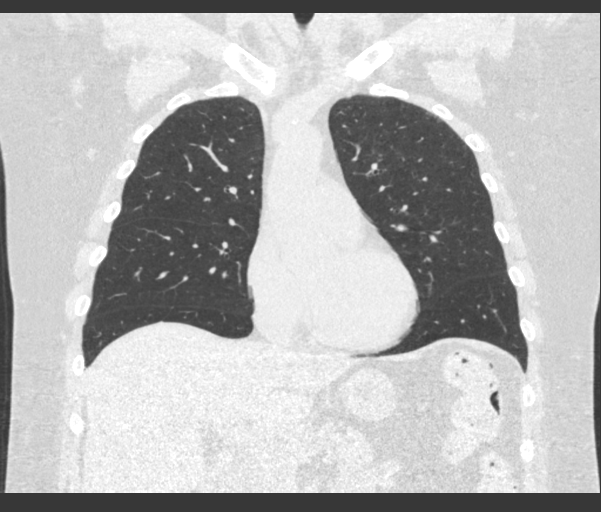

[15 of 36 positions shown; findings below may reference images not displayed]

FINDINGS: Cardiovascular: Heart size is normal. There is no significant
pericardial fluid, thickening or pericardial calcification. There is
aortic atherosclerosis, as well as atherosclerosis of the great
vessels of the mediastinum and the coronary arteries, including
calcified atherosclerotic plaque in the left circumflex coronary
artery.

Mediastinum/Nodes: No pathologically enlarged mediastinal or hilar
lymph nodes. Please note that accurate exclusion of hilar adenopathy
is limited on noncontrast CT scans. Esophagus is unremarkable in
appearance. No axillary lymphadenopathy.

Lungs/Pleura: Tiny pulmonary nodule in the periphery of the left
upper lobe (axial image 71 of series 3), with a volume derived mean
diameter of 1.6 mm. No other larger more suspicious appearing
pulmonary nodules or masses are noted. No acute consolidative
airspace disease. No pleural effusions. Mild diffuse bronchial wall
thickening with very mild centrilobular and paraseptal emphysema.

Upper Abdomen: Aortic atherosclerosis.

Musculoskeletal: There are no aggressive appearing lytic or blastic
lesions noted in the visualized portions of the skeleton.
IMPRESSION: 1. Lung-RADS 2S, benign appearance or behavior. Continue annual
screening with low-dose chest CT without contrast in 12 months.
2. The "S" modifier above refers to potentially clinically
significant non lung cancer related findings. Specifically, there is
aortic atherosclerosis, in addition to left circumflex coronary
artery disease. Please note that although the presence of coronary
artery calcium documents the presence of coronary artery disease,
the severity of this disease and any potential stenosis cannot be
assessed on this non-gated CT examination. Assessment for potential
risk factor modification, dietary therapy or pharmacologic therapy
may be warranted, if clinically indicated.
3. Mild diffuse bronchial wall thickening with very mild
centrilobular and paraseptal emphysema; imaging findings suggestive
of underlying COPD.

Aortic Atherosclerosis (N3SRH-08S.S) and Emphysema (N3SRH-OPL.1).

## 2022-09-22 ENCOUNTER — Telehealth: Payer: Self-pay | Admitting: Family

## 2022-09-22 MED ORDER — AMLODIPINE BESYLATE 2.5 MG PO TABS: 2.5000 mg | ORAL_TABLET | Freq: Every day | ORAL | 1 refills | Status: DC

## 2022-09-22 NOTE — Telephone Encounter (Signed)
Left message on machine that rx has been sent.

## 2022-09-22 NOTE — Addendum Note (Signed)
Addended by: Kem Boroughs D on: 09/22/2022 03:16 PM   Modules accepted: Orders

## 2022-09-22 NOTE — Telephone Encounter (Signed)
Please see rx request

## 2022-09-22 NOTE — Telephone Encounter (Signed)
Prescription Request  09/22/2022  Is this a "Controlled Substance" medicine? No  LOV: 08/13/2022  What is the name of the medication or equipment?  amLODipine (NORVASC) 2.5 MG tablet   Have you contacted your pharmacy to request a refill? No   Which pharmacy would you like this sent to?  [facility], [address] [address] Phone: [phone] Fax: [phone]    Patient notified that their request is being sent to the clinical staff for review and that they should receive a response within 2 business days.   Please advise at Mobile [phone] (mobile)

## 2022-09-27 ENCOUNTER — Ambulatory Visit (HOSPITAL_BASED_OUTPATIENT_CLINIC_OR_DEPARTMENT_OTHER)
Admission: RE | Admit: 2022-09-27 | Discharge: 2022-09-27 | Disposition: A | Discharge status: Home / Self Care | Payer: BC Managed Care – PPO | Source: Ambulatory Visit | Attending: Family | Admitting: Family

## 2022-09-27 DIAGNOSIS — M85851 Other specified disorders of bone density and structure, right thigh: Secondary | ICD-10-CM | POA: Insufficient documentation

## 2022-09-27 DIAGNOSIS — M8589 Other specified disorders of bone density and structure, multiple sites: Secondary | ICD-10-CM | POA: Diagnosis not present

## 2022-09-27 DIAGNOSIS — Z87891 Personal history of nicotine dependence: Secondary | ICD-10-CM | POA: Diagnosis not present

## 2022-09-27 DIAGNOSIS — Z8262 Family history of osteoporosis: Secondary | ICD-10-CM | POA: Diagnosis not present

## 2022-09-27 DIAGNOSIS — M8580 Other specified disorders of bone density and structure, unspecified site: Secondary | ICD-10-CM

## 2022-09-28 ENCOUNTER — Other Ambulatory Visit: Payer: Self-pay

## 2022-09-28 DIAGNOSIS — Z87891 Personal history of nicotine dependence: Secondary | ICD-10-CM

## 2022-09-29 ENCOUNTER — Encounter: Payer: Self-pay | Admitting: Family

## 2022-11-19 ENCOUNTER — Telehealth: Payer: Self-pay | Admitting: Family

## 2022-11-19 ENCOUNTER — Other Ambulatory Visit: Payer: Self-pay

## 2022-11-19 MED ORDER — AMLODIPINE BESYLATE 2.5 MG PO TABS: 2.5000 mg | ORAL_TABLET | Freq: Every day | ORAL | 1 refills | Status: DC

## 2022-11-19 MED ORDER — ATORVASTATIN CALCIUM 20 MG PO TABS: 20.0000 mg | ORAL_TABLET | Freq: Every day | ORAL | 0 refills | Status: DC

## 2022-11-19 NOTE — Telephone Encounter (Signed)
Rx sent 

## 2022-11-19 NOTE — Telephone Encounter (Signed)
Prescription Request  11/19/2022  Is this a "Controlled Substance" medicine? No  LOV: 08/13/2022  What is the name of the medication or equipment?   atorvastatin (LIPITOR) 20 MG tablet [[id number]]   Have you contacted your pharmacy to request a refill? No   Which pharmacy would you like this sent to?  [email or web address] - [place], [place] - [address] [place] [address] Phone: [phone] Fax: [phone]    Patient notified that their request is being sent to the clinical staff for review and that they should receive a response within 2 business days.   Please advise at Mobile [phone] (mobile)

## 2022-12-14 ENCOUNTER — Encounter: Payer: Self-pay | Admitting: Internal Medicine

## 2022-12-14 ENCOUNTER — Ambulatory Visit: Payer: BC Managed Care – PPO | Admitting: Internal Medicine

## 2022-12-14 VITALS — BP 120/78 | HR 70 | Ht 64.0 in | Wt 183.0 lb

## 2022-12-14 DIAGNOSIS — E213 Hyperparathyroidism, unspecified: Secondary | ICD-10-CM | POA: Diagnosis not present

## 2022-12-14 DIAGNOSIS — E041 Nontoxic single thyroid nodule: Secondary | ICD-10-CM

## 2022-12-14 DIAGNOSIS — E039 Hypothyroidism, unspecified: Secondary | ICD-10-CM

## 2022-12-14 LAB — VITAMIN D 25 HYDROXY (VIT D DEFICIENCY, FRACTURES): VITD: 39.37 ng/mL (ref 30.00–100.00)

## 2022-12-14 LAB — TSH: TSH: 0.11 u[IU]/mL — ABNORMAL LOW (ref 0.35–5.50)

## 2022-12-14 LAB — ALBUMIN: Albumin: 4.4 g/dL (ref 3.5–5.2)

## 2022-12-14 MED ORDER — LEVOTHYROXINE SODIUM 112 MCG PO TABS: 112.0000 ug | ORAL_TABLET | Freq: Every day | ORAL | 3 refills | Status: DC

## 2022-12-14 NOTE — Progress Notes (Signed)
Name: Melissa Gibson  MRN/ DOB: [id number], 1966/03/07    Age/ Sex: 57 y.o., female     PCP: Sandford Craze, NP   Reason for Endocrinology Evaluation: Right thyroid nodule      Initial Endocrinology Clinic Visit: 05/19/2017    PATIENT IDENTIFIER: [name] is a 57 y.o., female with a past medical history of dyslipidemia and hypothyroidism. She has followed with [facility] since 05/19/2017 for consultative assistance with management of her hypothyroid/thyroid nodule .   HISTORICAL SUMMARY: The patient was first diagnosed with hypercalcemia in 2019, with a serum max of 10.7 mg/dL in 10/979 ( non-corrected) . Her PTH level has been fluctuating between 38 - 84 pg/mL  She has not official diagnosis of renal stones  NO hx of bone fractures  DXA showed low bone density 10/2019   No FH of renal stones or hypercalcemia   Her labs have been normal since 2021  I am not sure if she truly had  true hypercalcemia, the first time with serum calcium of 10.3 mg/dL , her albumin was 4.3 g/dL with a corrected calcium of 10.06 mg/dL, which is normal. The second time she had an elevated serum calcium, there was no concomitant albumin . But repeat serum calcium and ionized calcium has been normal.      THYROID HISTORY: She has been diagnosed with right thyroid nodule since 2018. This has been stable .  She has been on levothyroxine since 2016   SUBJECTIVE:     Today (12/14/2022):  [name] is here for a follow up on hypothyroidism  and MNG.    Denies local neck swelling  Denies constipation or diarrhea  Continues with  occasional palpitations   Vitamin D 1000 iu daily  Levothyroxine 125 mcg, half a tablet on Sundays and 1 tablet the rest of the week    HISTORY:  Past Medical History:  Past Medical History:  Diagnosis Date   Abnormal Pap smear of cervix    -age 47/unsure if she had any treatment/03-03-15 LSIL:Pos HR HPV;LEEP 04-14-15 LGSIL  w/neg margins   CIN II (cervical intraepithelial neoplasia II) 03/05/2016   Constipation 10/07/2020   COVID 01/12/2021   HYPERLIPIDEMIA 10/08/2008   Qualifier: Diagnosis of  By: Yetta Barre MD, Bernadene Bell.    Hyperparathyroidism ([facility]) 10/22/2019   Hypothyroidism 10/08/2008   Qualifier: Diagnosis of  By: Yetta Barre MD, Bernadene Bell.    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   LEEP  04/14/2015   CIN I/2   NOVASURE ABLATION  11/03/2010   TUBAL LIGATION  07/05/2004   Social History:  reports that she quit smoking about 14 years ago. Her smoking use included cigarettes. She has a 30.00 pack-year smoking history. She has never used smokeless tobacco. She reports current alcohol use of about 1.0 - 2.0 standard drink of alcohol per week. She reports that she does not use drugs. Family History:  Family History  Problem Relation Age of Onset   COPD Mother        on hospice   Hyperlipidemia Mother    Heart attack Father    Hypertension Father  Heart failure Father    Hyperlipidemia Father    Lung cancer Sister 73       lung cancer   Infertility Sister    Thyroid disease Sister    Cancer Sister        lung; died at 28yr   Thyroid disease Sister        hypothyroid   Heart attack Brother 31   COPD Brother    Cirrhosis Brother        caused his death, ETOH abuse   Cancer Maternal Grandmother        lymph nodes   Arthritis Maternal Grandfather    Breast cancer Neg Hx    Hypercalcemia Neg Hx      HOME MEDICATIONS: Allergies as of 12/14/2022   No Known Allergies      Medication List        Accurate as of December 14, 2022   7:51 AM. If you have any questions, ask your nurse or doctor.          amLODipine 2.5 MG tablet Commonly known as: NORVASC Take 1 tablet (2.5 mg total) by mouth daily.   atorvastatin 20 MG tablet Commonly known as: LIPITOR Take 1 tablet (20 mg total) by mouth daily.   levothyroxine 125 MCG tablet Commonly known as: SYNTHROID Take 1 tablet (125 mcg total) by mouth daily.          OBJECTIVE:   PHYSICAL EXAM: VS: BP 120/78 (BP Location: Left Arm, Patient Position: Sitting, Cuff Size: Large)   Pulse 70   Ht 5\' 4"  (1.626 m)   Wt 183 lb (83 kg)   SpO2 98%   BMI 31.41 kg/m    EXAM: General: Pt appears well and is in NAD  Neck: General: Supple without adenopathy. Thyroid: Right thyroid nodule appreciated   Lungs: Clear with good BS bilat with no rales, rhonchi, or wheezes  Heart: Auscultation: RRR.  Abdomen: Normoactive bowel sounds, soft, nontender, without masses or organomegaly palpable  Extremities:  BL LE: No pretibial edema normal ROM and strength.  Mental Status: Judgment, insight: Intact Orientation: Oriented to time, place, and person Mood and affect: No depression, anxiety, or agitation     DATA REVIEWED:   Latest Reference Range & Units 12/14/22 07:57  Albumin 3.5 - 5.2 g/dL 4.4  VITD 29.56 - 213.08 ng/mL 39.37    Latest Reference Range & Units 12/14/22 07:57  TSH 0.35 - 5.50 uIU/mL 0.11 (L)  (L): Data is abnormally low    Latest Reference Range & Units 06/14/22 15:22  Sodium 135 - 145 mEq/L 139  Potassium 3.5 - 5.1 mEq/L 4.5  Chloride 96 - 112 mEq/L 102  CO2 19 - 32 mEq/L 30  Glucose 70 - 99 mg/dL 84  BUN 6 - 23 mg/dL 20  Creatinine 6.57 - 8.46 mg/dL 9.62  Calcium 8.4 - 95.2 mg/dL 84.1  Calcium Ionized 4.7 - 5.5 mg/dL 5.4  Albumin 3.5 - 5.2 g/dL 4.5  GFR >32.44 mL/min 73.57    Latest Reference Range & Units 06/14/22 15:22  VITD 30.00 - 100.00 ng/mL 26.29 (L)    Latest Reference Range & Units 06/14/22 15:22  PTH, Intact 16 - 77 pg/mL  55  TSH 0.35 - 5.50 uIU/mL 7.51 (H)  (H): Data is abnormally high    Thyroid Ultrasound 07/08/2022 Estimated total number of nodules >/= 1 cm: 1   Number of spongiform nodules >/=  2 cm not described below (TR1): 0   Number of mixed  cystic and solid nodules >/= 1.5 cm not described below (TR2): 0   _________________________________________________________   Nodule labeled 1 is a small subcentimeter solid hyperechoic TR 3 nodule in the superior right thyroid lobe measuring 0.7 cm. Given size (<1.4 cm) and appearance, this nodule does NOT meet TI-RADS criteria for biopsy or dedicated follow-up.   Nodule labeled 2 is a mixed cystic and solid isoechoic TR 2 nodule in the mid thyroid lobe measuring 2.3 x 1.5 x 1.3 cm (see series 1, image 8). This nodule was previously described as a solid isoechoic nodule measuring up to 1.9 cm in October 2021. This nodule does NOT meet TI-RADS criteria for biopsy or dedicated follow-up.   IMPRESSION: 1. Heterogeneous, multinodular thyroid gland. No new thyroid nodules. 2. Nodule labeled 2 in the mid right thyroid lobe demonstrates interval cystic changes, and as such has been downgraded from TR 3 to TR 2. Given these changes and 5 years total follow-up, this nodule can be considered benign and no longer meets criteria for further dedicated follow-up or biopsy.    ASSESSMENT / PLAN / RECOMMENDATIONS:   1. Hypothyroidism:  - Pt is clinically euthyroid  -TSH is low, despite decreasing levothyroxine -The patient with variable levothyroxine absorption, she assures me compliance -If TSH continues to fluctuate, we may have to switch formulation to brand for more consistent absorption   Medication Stop Levothyroxine 125 mcg daily  Start levothyroxine 112 mcg daily   2.  Hyperparathyroidism :  -Historically she has had elevated serum calcium, the ones with a concomitant albumin after correction were normal. -PTH pending today -Vitamin D is  normal -Patient to continue vitamin D 1000 iu daily and consume 2-3 servings of dietary calcium daily      3. MNG:   - Pt denies any local neck symptoms  -Last thyroid ultrasound 07/2022 revealed more of a cystic component, and no further imaging was recommended after 5 years of follow-up, per radiology, recommendation, but I did discuss with the patient that I would recommend repeating ultrasound around 2026, as despite the cystic component of the nodule it has increased in size from 1.9 cm to 2.3 cm   F/U in 6 months    Signed electronically by: Lyndle Herrlich, MD  [facility] End[facility]  [facility] [address] [place], [place] [address] Phone: [phone] FAX: [phone]      CC: Xuan, Miller, NP [address] [place] [address] Phone: [phone]  Fax: [phone]   Return to Endocrinology clinic as below: No future appointments.

## 2022-12-15 LAB — PARATHYROID HORMONE, INTACT (NO CA): PTH: 52 pg/mL (ref 16–77)

## 2022-12-28 DIAGNOSIS — B002 Herpesviral gingivostomatitis and pharyngotonsillitis: Secondary | ICD-10-CM | POA: Diagnosis not present

## 2023-01-31 ENCOUNTER — Other Ambulatory Visit: Payer: Self-pay | Admitting: Family

## 2023-04-12 ENCOUNTER — Other Ambulatory Visit: Payer: Self-pay | Admitting: Family

## 2023-04-12 NOTE — Telephone Encounter (Signed)
Please contact patient to schedule a follow up visit.

## 2023-04-13 NOTE — Telephone Encounter (Signed)
Lvm2 sched  

## 2023-04-22 ENCOUNTER — Ambulatory Visit: Payer: BC Managed Care – PPO | Admitting: Family

## 2023-04-22 VITALS — BP 120/62 | HR 68 | Temp 98.0°F | Resp 18 | Ht 64.0 in | Wt 176.6 lb

## 2023-04-22 DIAGNOSIS — Z8616 Personal history of COVID-19: Secondary | ICD-10-CM | POA: Diagnosis not present

## 2023-04-22 DIAGNOSIS — E039 Hypothyroidism, unspecified: Secondary | ICD-10-CM

## 2023-04-22 DIAGNOSIS — I10 Essential (primary) hypertension: Secondary | ICD-10-CM

## 2023-04-22 DIAGNOSIS — B001 Herpesviral vesicular dermatitis: Secondary | ICD-10-CM | POA: Diagnosis not present

## 2023-04-22 MED ORDER — VALACYCLOVIR HCL 1 G PO TABS: ORAL_TABLET | ORAL | 1 refills | Status: DC

## 2023-04-22 NOTE — Assessment & Plan Note (Signed)
  Managed by Dr. Virgilio Frees with Levothyroxine. Last check in June, follow-up in December. -No changes to current management.

## 2023-04-22 NOTE — Assessment & Plan Note (Signed)
  Blood pressure initially borderline but normalized on recheck. Currently on low dose Amlodipine. -Continue Amlodipine.

## 2023-04-22 NOTE — Progress Notes (Signed)
Subjective:     Patient ID: Melissa Gibson, female    DOB: May 18, 1966, 57 y.o.   MRN: [id number]  Chief Complaint  Patient presents with   Follow-up    HPI  Discussed the use of AI scribe software for clinical note transcription with the patient, who gave verbal consent to proceed.  History of Present Illness   Patient presents today for a follow up visit. She has a history of hypertension managed with amlodipine and hypothyroidism managed by Endo with levothyroxine, presents for a follow-up visit. The patient reports a recent increase in the frequency of cold sores, experiencing them almost monthly or every other month for the past year. This is a significant increase from her past history. The patient had a recent episode where she experienced three cold sores simultaneously. The patient sought treatment through [facility] and found the prescribed medication effective. The patient also reports a recent COVID-19 infection six weeks ago after traveling to [place]. This is the second time the patient has contracted COVID-19.      BP Readings from Last 3 Encounters:  04/22/23 120/62  12/14/22 120/78  08/13/22 118/65   Lab Results  Component Value Date   TSH 0.11 (L) 12/14/2022   Lab Results  Component Value Date   CHOL 161 08/13/2022   HDL 58.90 08/13/2022   LDLCALC 88 08/13/2022   LDLDIRECT 125.0 01/29/2010   TRIG 70.0 08/13/2022   CHOLHDL 3 08/13/2022      Health Maintenance Due  Topic Date Due   INFLUENZA VACCINE  02/03/2023   COVID-19 Vaccine (4 - 2023-24 season) 03/06/2023    Past Medical History:  Diagnosis Date   Abnormal Pap smear of cervix    -age 49/unsure if she had any treatment/03-03-15 LSIL:Pos HR HPV;LEEP 04-14-15 LGSIL w/neg margins   CIN II (cervical intraepithelial neoplasia II) 03/05/2016   Constipation 10/07/2020   COVID 01/12/2021   HYPERLIPIDEMIA 10/08/2008   Qualifier: Diagnosis of  By: Yetta Barre MD, Bernadene Bell.    Hyperparathyroidism (HCC)  10/22/2019   Hypothyroidism 10/08/2008   Qualifier: Diagnosis of  By: Yetta Barre MD, Bernadene Bell.    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   LEEP  04/14/2015   CIN I/2   NOVASURE ABLATION  11/03/2010   TUBAL LIGATION  07/05/2004    Family History  Problem Relation Age of Onset   COPD Mother        on hospice   Hyperlipidemia Mother    Heart attack Father    Hypertension Father    Heart failure Father    Hyperlipidemia Father    Lung cancer Sister 70       lung cancer   Infertility Sister    Thyroid disease Sister    Cancer Sister        lung; died at 16yr   Thyroid disease Sister        hypothyroid   Heart attack Brother 55   COPD Brother    Cirrhosis Brother        caused his death, ETOH abuse   Cancer Maternal Grandmother        lymph nodes   Arthritis  Maternal Grandfather    Breast cancer Neg Hx    Hypercalcemia Neg Hx     Social History   Socioeconomic History   Marital status: Married    Spouse name: Not on file   Number of children: Not on file   Years of education: Not on file   Highest education level: Some college, no degree  Occupational History   Occupation: Airline pilot  Tobacco Use   Smoking status: Former    Current packs/day: 0.00    Average packs/day: 1 pack/day for 30.0 years (30.0 ttl pk-yrs)    Types: Cigarettes    Start date: 07/05/1978    Quit date: 07/05/2008    Years since quitting: 14.8   Smokeless tobacco: Never   Tobacco comments:    quit in 2010  Substance and Sexual Activity   Alcohol use: Yes    Alcohol/week: 1.0 - 2.0 standard drink of alcohol    Types: 1 - 2 Standard drinks or equivalent  per week   Drug use: No   Sexual activity: Yes    Partners: Male    Birth control/protection: Surgical    Comment: BTL, Ablation  Other Topics Concern   Not on file  Social History Narrative   Regular Exercise    Former smoker   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   Married   No children   1 year of college   Enjoys crafts, Presenter, broadcasting.   Social Determinants of Health   Financial Resource Strain: Low Risk  (04/21/2023)   Overall Financial Resource Strain (CARDIA)    Difficulty of Paying Living Expenses: Not very hard  Food Insecurity: No Food Insecurity (04/21/2023)   Hunger Vital Sign    Worried About Running Out of Food in the Last Year: Never true    Ran Out of Food in the Last Year: Never true  Transportation Needs: No Transportation Needs (04/21/2023)   PRAPARE - Administrator, Civil Service (Medical): No    Lack of Transportation (Non-Medical): No  Physical Activity: Insufficiently Active (04/21/2023)   Exercise Vital Sign    Days of Exercise per Week: 3 days    Minutes of Exercise per Session: 20 min  Stress: No Stress Concern Present (04/21/2023)   [employer] of Occupational Health - Occupational Stress Questionnaire    Feeling of Stress : Only a little  Social Connections: Moderately Integrated (04/21/2023)   Social Connection and Isolation Panel [NHANES]    Frequency of Communication with Friends and Family: Twice a week    Frequency of Social Gatherings with Friends and Family: Once a week    Attends Religious Services: 1 to 4 times per year    Active Member of [employer] or Organizations: No    Attends Engineer, structural: Not on file    Marital Status: Married  Catering manager Violence: Not on file    Outpatient Medications Prior to Visit  Medication Sig Dispense Refill   amLODipine (NORVASC) 2.5 MG tablet Take 1 tablet (2.5 mg total) by mouth daily. 90 tablet 1   atorvastatin (LIPITOR) 20 MG tablet Take 1 tablet by mouth daily. 90  tablet 0   levothyroxine (SYNTHROID) 112 MCG tablet Take 1 tablet (112 mcg total) by mouth daily. 90 tablet 3   No facility-administered medications prior to visit.    No Known Allergies  ROS    See HPI Objective:    Physical Exam Constitutional:      General: She is not in acute distress.  Appearance: Normal appearance. She is well-developed.  HENT:     Head: Normocephalic and atraumatic.     Right Ear: External ear normal.     Left Ear: External ear normal.  Eyes:     General: No scleral icterus. Neck:     Thyroid: No thyromegaly.  Cardiovascular:     Rate and Rhythm: Normal rate and regular rhythm.     Heart sounds: Normal heart sounds. No murmur heard. Pulmonary:     Effort: Pulmonary effort is normal. No respiratory distress.     Breath sounds: Normal breath sounds. No wheezing.  Musculoskeletal:     Cervical back: Neck supple.  Skin:    General: Skin is warm and dry.  Neurological:     Mental Status: She is alert and oriented to person, place, and time.  Psychiatric:        Mood and Affect: Mood normal.        Behavior: Behavior normal.        Thought Content: Thought content normal.        Judgment: Judgment normal.      BP 120/62   Pulse 68   Temp 98 F (36.7 C)   Resp 18   Ht 5\' 4"  (1.626 m)   Wt 176 lb 9.6 oz (80.1 kg)   SpO2 98%   BMI 30.31 kg/m  Wt Readings from Last 3 Encounters:  04/22/23 176 lb 9.6 oz (80.1 kg)  12/14/22 183 lb (83 kg)  08/13/22 183 lb (83 kg)       Assessment & Plan:   Problem List Items Addressed This Visit       Unprioritized   Recurrent cold sores - Primary     Increased frequency of cold sores over the past year. Discussed the option of suppressive therapy with Valtrex. -Prescribe Valtrex 1g, 2 tablets at the onset of symptoms, repeat dose after 12 hours. -Consider daily suppressive therapy if frequency or severity increases.      Relevant Medications   valACYclovir (VALTREX) 1000 MG tablet    Hypothyroidism     Managed by Dr. Virgilio Frees with Levothyroxine. Last check in June, follow-up in December. -No changes to current management.      Essential hypertension     Blood pressure initially borderline but normalized on recheck. Currently on low dose Amlodipine. -Continue Amlodipine.       COVID-19 Recent infection 6 weeks ago. Discussed timing for COVID booster. -Plan to get COVID booster 6 weeks post-infection.  General Health Maintenance -Encouraged to get flu shot through work program. -Schedule 40-month follow-up visit. I am having Rubi A. Lendell Caprice start on valACYclovir. I am also having her maintain her amLODipine, levothyroxine, and atorvastatin.  Meds ordered this encounter  Medications   valACYclovir (VALTREX) 1000 MG tablet    Sig: 2 tabs by mouth at start of cold sore.  Repeat in 2 hours    Dispense:  20 tablet    Refill:  1    Order Specific Question:   Supervising Provider    Answer:   Danise Edge A [[id number]]

## 2023-04-22 NOTE — Assessment & Plan Note (Signed)
  Increased frequency of cold sores over the past year. Discussed the option of suppressive therapy with Valtrex. -Prescribe Valtrex 1g, 2 tablets at the onset of symptoms, repeat dose after 12 hours. -Consider daily suppressive therapy if frequency or severity increases.

## 2023-04-22 NOTE — Patient Instructions (Signed)
VISIT SUMMARY:  During your recent visit, we discussed your increased frequency of cold sores, your hypertension, hypothyroidism, and recent COVID-19 infection. We also talked about your general health maintenance.  YOUR PLAN:  -RECURRENT COLD SORES: You've been experiencing cold sores more frequently. We discussed starting a medication called Valtrex to help manage this. If the frequency or severity of your cold sores increases, we may consider daily treatment.  -HYPERTENSION: Your blood pressure was initially a bit high, but it normalized during your visit. You should continue taking your current medication, Amlodipine, to manage this.  -HYPOTHYROIDISM: Your hypothyroidism, a condition where your thyroid gland doesn't produce enough thyroid hormones, is being managed by Dr. Virgilio Frees with a medication called Levothyroxine. There are no changes to this treatment plan.  -COVID-19: You recently had a COVID-19 infection. We discussed the timing for your COVID-19 booster shot, which should be 6 weeks after your infection.  -GENERAL HEALTH MAINTENANCE: For your overall health, I encourage you to get a flu shot through your work program. Also, please schedule a follow-up visit with [place] in 6 months.  INSTRUCTIONS:  Please start taking Valtrex at the onset of cold sore symptoms, taking 2 tablets and repeating the dose after 12 hours. Continue taking your Amlodipine for your hypertension. Plan to get your COVID-19 booster shot 6 weeks after your infection. Also, remember to get your flu shot and schedule a follow-up visit with [place] in 6 months.

## 2023-04-26 ENCOUNTER — Other Ambulatory Visit: Payer: Self-pay

## 2023-04-26 ENCOUNTER — Telehealth: Payer: Self-pay | Admitting: Family

## 2023-04-26 DIAGNOSIS — E039 Hypothyroidism, unspecified: Secondary | ICD-10-CM

## 2023-04-26 MED ORDER — VALACYCLOVIR HCL 1 G PO TABS: ORAL_TABLET | ORAL | 1 refills | Status: DC

## 2023-04-26 NOTE — Telephone Encounter (Signed)
[facility] pharmacist called for clarification on a prescription that was sent in for valACYclovir (VALTREX) 1000 MG tablet. They would like pcp to clarify whether the pt needs to repeat every 2 hours? She explained that normally it's supposed to be repeated every 12 not 2. Please call and advise with pharmacy.

## 2023-04-26 NOTE — Telephone Encounter (Signed)
Hard to get through on the phone, new rx sent with dirrections take one and repeat in 12 house per PCP

## 2023-06-09 ENCOUNTER — Encounter: Payer: Self-pay | Admitting: Internal Medicine

## 2023-06-09 ENCOUNTER — Ambulatory Visit: Payer: BC Managed Care – PPO | Admitting: Internal Medicine

## 2023-06-09 VITALS — BP 120/80 | HR 76 | Ht 64.0 in | Wt 177.0 lb

## 2023-06-09 DIAGNOSIS — E213 Hyperparathyroidism, unspecified: Secondary | ICD-10-CM | POA: Diagnosis not present

## 2023-06-09 DIAGNOSIS — E039 Hypothyroidism, unspecified: Secondary | ICD-10-CM | POA: Diagnosis not present

## 2023-06-09 DIAGNOSIS — E042 Nontoxic multinodular goiter: Secondary | ICD-10-CM

## 2023-06-09 NOTE — Patient Instructions (Signed)
Hold Biotin 2-3 days before any future thyroid blood work    You are on levothyroxine - which is your thyroid hormone supplement. You MUST take this consistently.  You should take this first thing in the morning on an empty stomach with water. You should not take it with other medications. Wait 30min to 1hr prior to eating. If you are taking any vitamins - please take these in the evening.   If you miss a dose, please take your missed dose the following day (double the dose for that day). You should have a pill box for ONLY levothyroxine on your bedside table to help you remember to take your medications.  

## 2023-06-09 NOTE — Progress Notes (Signed)
Name: Melissa Gibson  MRN/ DOB: [id number], [birth year]    Age/ Sex: 57 y.o., female     PCP: Sandford Craze, NP   Reason for Endocrinology Evaluation: Right thyroid nodule      Initial Endocrinology Clinic Visit: 05/19/2017    PATIENT IDENTIFIER: Melissa Gibson is a 57 y.o., female with a past medical history of dyslipidemia and hypothyroidism. She has followed with [facility] since 05/19/2017 for consultative assistance with management of her hypothyroid/thyroid nodule .   HISTORICAL SUMMARY: The patient was first diagnosed with hypercalcemia in 2019, with a serum max of 10.7 mg/dL in 07/4780 ( non-corrected) . Her PTH level has been fluctuating between 38 - 84 pg/mL  She has not official diagnosis of renal stones  NO hx of bone fractures  DXA showed low bone density 10/2019   No FH of renal stones or hypercalcemia   Her labs have been normal since 2021  I am not sure if she truly had  true hypercalcemia, the first time with serum calcium of 10.3 mg/dL , her albumin was 4.3 g/dL with a corrected calcium of 10.06 mg/dL, which is normal. The second time she had an elevated serum calcium, there was no concomitant albumin . But repeat serum calcium and ionized calcium has been normal.      THYROID HISTORY: She has been diagnosed with right thyroid nodule since 2018. This has been stable .  She has been on levothyroxine since 2016   SUBJECTIVE:     Today (06/09/2023):  Melissa Gibson is here for a follow up on hypothyroidism  and MNG and hyperparathyroidism.     Weight has been trending down Denies local neck swelling  Has occasional  constipation but no  diarrhea  Has  occasional palpitations - once a month  Has noted hair thinning  Biotin -no  Has polydipsia      Vitamin D 1000 iu daily  Levothyroxine 112 mcg daily  2-3 servings of dietary calcium daily   HISTORY:  Past Medical History:  Past Medical History:  Diagnosis Date    Abnormal Pap smear of cervix    -age 64/unsure if she had any treatment/03-03-15 LSIL:Pos HR HPV;LEEP 04-14-15 LGSIL w/neg margins   CIN II (cervical intraepithelial neoplasia II) 03/05/2016   Constipation 10/07/2020   COVID 01/12/2021   HYPERLIPIDEMIA 10/08/2008   Qualifier: Diagnosis of  By: Yetta Barre MD, Bernadene Bell.    Hyperparathyroidism (HCC) 10/22/2019   Hypothyroidism 10/08/2008   Qualifier: Diagnosis of  By: Yetta Barre MD, Bernadene Bell.    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   LEEP  04/14/2015   CIN I/2   NOVASURE ABLATION  11/03/2010   TUBAL LIGATION  07/05/2004   Social History:  reports that she quit smoking about 14 years ago. Her smoking use included cigarettes. She started smoking about 44 years ago. She has a 30 pack-year smoking history. She has never used smokeless tobacco. She reports current alcohol use of about 1.0 - 2.0 standard drink of alcohol per week. She reports that she does not use drugs. Family History:  Family History  Problem Relation Age  of Onset   COPD Mother        on hospice   Hyperlipidemia Mother    Heart attack Father    Hypertension Father    Heart failure Father    Hyperlipidemia Father    Lung cancer Sister 79       lung cancer   Infertility Sister    Thyroid disease Sister    Cancer Sister        lung; died at 53yr   Thyroid disease Sister        hypothyroid   Heart attack Brother 12   COPD Brother    Cirrhosis Brother        caused his death, ETOH abuse   Cancer Maternal Grandmother        lymph nodes   Arthritis Maternal Grandfather    Breast cancer Neg Hx     Hypercalcemia Neg Hx      HOME MEDICATIONS: Allergies as of 06/09/2023   No Known Allergies      Medication List        Accurate as of June 09, 2023  7:55 AM. If you have any questions, ask your nurse or doctor.          amLODipine 2.5 MG tablet Commonly known as: NORVASC Take 1 tablet (2.5 mg total) by mouth daily.   atorvastatin 20 MG tablet Commonly known as: LIPITOR Take 1 tablet by mouth daily.   levothyroxine 112 MCG tablet Commonly known as: SYNTHROID Take 1 tablet (112 mcg total) by mouth daily.   valACYclovir 1000 MG tablet Commonly known as: Valtrex 2 tabs by mouth at start of cold sore.  Repeat in 12 hours          OBJECTIVE:   PHYSICAL EXAM: VS: BP 120/80 (BP Location: Left Arm, Patient Position: Sitting, Cuff Size: Large)   Pulse 76   Ht 5\' 4"  (1.626 m)   Wt 177 lb (80.3 kg)   SpO2 98%   BMI 30.38 kg/m    EXAM: General: Pt appears well and is in NAD  Neck: General: Supple without adenopathy. Thyroid: Right thyroid nodule appreciated   Lungs: Clear with good BS bilat with no rales, rhonchi, or wheezes  Heart: Auscultation: RRR.  Abdomen: Normoactive bowel sounds, soft, nontender, without masses or organomegaly palpable  Extremities:  BL LE: No pretibial edema normal ROM and strength.  Mental Status: Judgment, insight: Intact Orientation: Oriented to time, place, and person Mood and affect: No depression, anxiety, or agitation     DATA REVIEWED:   Latest Reference Range & Units 06/09/23 07:58  Sodium 135 - 146 mmol/L 140  Potassium 3.5 - 5.3 mmol/L 4.6  Chloride 98 - 110 mmol/L 101  CO2 20 - 32 mmol/L 31  Glucose 65 - 99 mg/dL 94  BUN 7 - 25 mg/dL 18  Creatinine 1.61 - 0.96 mg/dL 0.45  Calcium 8.6 - 40.9 mg/dL 9.8  BUN/Creatinine Ratio 6 - 22 (calc) SEE NOTE:    Latest Reference Range & Units 06/09/23 07:58  Vitamin D, 25-Hydroxy 30 - 100 ng/mL 51    Latest Reference Range & Units 06/09/23 07:58  TSH 0.40 - 4.50 mIU/L  2.23  T4,Free(Direct) 0.8 - 1.8 ng/dL 1.3  Albumin MSPROF 3.6 - 5.1 g/dL 4.3      Thyroid Ultrasound 07/08/2022 Estimated total number of nodules >/= 1 cm: 1   Number of spongiform nodules >/=  2 cm not described below (TR1): 0   Number of mixed cystic and  solid nodules >/= 1.5 cm not described below (TR2): 0   _________________________________________________________   Nodule labeled 1 is a small subcentimeter solid hyperechoic TR 3 nodule in the superior right thyroid lobe measuring 0.7 cm. Given size (<1.4 cm) and appearance, this nodule does NOT meet TI-RADS criteria for biopsy or dedicated follow-up.   Nodule labeled 2 is a mixed cystic and solid isoechoic TR 2 nodule in the mid thyroid lobe measuring 2.3 x 1.5 x 1.3 cm (see series 1, image 8). This nodule was previously described as a solid isoechoic nodule measuring up to 1.9 cm in October 2021. This nodule does NOT meet TI-RADS criteria for biopsy or dedicated follow-up.   IMPRESSION: 1. Heterogeneous, multinodular thyroid gland. No new thyroid nodules. 2. Nodule labeled 2 in the mid right thyroid lobe demonstrates interval cystic changes, and as such has been downgraded from TR 3 to TR 2. Given these changes and 5 years total follow-up, this nodule can be considered benign and no longer meets criteria for further dedicated follow-up or biopsy.    ASSESSMENT / PLAN / RECOMMENDATIONS:   1. Hypothyroidism:  - Pt is clinically euthyroid  -TFTs are normal, no change   Medication Continue levothyroxine 112 mcg daily   2.  Hyperparathyroidism :  -Historically she has had elevated serum calcium, the ones with a concomitant albumin after correction were normal. -PTH pending today -Vitamin D, serum calcium, and GFR are normal   Recommendations Consume 2-3 servings of dietary calcium daily Vitamin D3 1000 IU daily    3. MNG:   - Pt denies any local neck symptoms  -Last thyroid ultrasound 07/2022 revealed  more of a cystic component, and no further imaging was recommended after 5 years of follow-up, per radiology recommendation, but I did discuss with the patient that I would recommend repeating ultrasound around 2026, as despite the cystic component of the nodule it has increased in size from 1.9 cm to 2.3 cm   F/U in 6 months    Signed electronically by: Lyndle Herrlich, MD  [facility]  [facility] [address] [place] [place], [place] [address] Phone: [phone] FAX: [phone]      CC: Aydrianna, Stroman, NP [address] [place] [address] Phone: [phone]  Fax: [phone]   Return to Endocrinology clinic as below: Future Appointments  Date Time Provider Department Center  10/26/2023  7:20 AM Sandford Craze, NP [facility]  12/12/2023  7:50 AM Lynett Brasil, Konrad Dolores, MD [facility] None

## 2023-06-10 DIAGNOSIS — E213 Hyperparathyroidism, unspecified: Secondary | ICD-10-CM | POA: Insufficient documentation

## 2023-06-10 DIAGNOSIS — E042 Nontoxic multinodular goiter: Secondary | ICD-10-CM | POA: Insufficient documentation

## 2023-06-10 LAB — BASIC METABOLIC PANEL
BUN: 18 mg/dL (ref 7–25)
CO2: 31 mmol/L (ref 20–32)
Calcium: 9.8 mg/dL (ref 8.6–10.4)
Chloride: 101 mmol/L (ref 98–110)
Creat: 0.85 mg/dL (ref 0.50–1.03)
Glucose, Bld: 94 mg/dL (ref 65–99)
Potassium: 4.6 mmol/L (ref 3.5–5.3)
Sodium: 140 mmol/L (ref 135–146)

## 2023-06-10 LAB — CALCIUM, IONIZED: Calcium, Ion: 5.1 mg/dL (ref 4.7–5.5)

## 2023-06-10 LAB — PARATHYROID HORMONE, INTACT (NO CA): PTH: 60 pg/mL (ref 16–77)

## 2023-06-10 LAB — T4, FREE: Free T4: 1.3 ng/dL (ref 0.8–1.8)

## 2023-06-10 LAB — TSH: TSH: 2.23 m[IU]/L (ref 0.40–4.50)

## 2023-06-10 LAB — VITAMIN D 25 HYDROXY (VIT D DEFICIENCY, FRACTURES): Vit D, 25-Hydroxy: 51 ng/mL (ref 30–100)

## 2023-06-10 LAB — ALBUMIN: Albumin: 4.3 g/dL (ref 3.6–5.1)

## 2023-06-10 MED ORDER — LEVOTHYROXINE SODIUM 112 MCG PO TABS: 112.0000 ug | ORAL_TABLET | Freq: Every day | ORAL | 3 refills | Status: DC

## 2023-07-12 ENCOUNTER — Other Ambulatory Visit: Payer: Self-pay | Admitting: Family

## 2023-07-12 DIAGNOSIS — Z1231 Encounter for screening mammogram for malignant neoplasm of breast: Secondary | ICD-10-CM

## 2023-07-13 ENCOUNTER — Other Ambulatory Visit: Payer: Self-pay | Admitting: Family

## 2023-08-05 ENCOUNTER — Ambulatory Visit
Admission: RE | Admit: 2023-08-05 | Discharge: 2023-08-05 | Disposition: A | Discharge status: Home / Self Care | Payer: BC Managed Care – PPO | Source: Ambulatory Visit | Attending: Family | Admitting: Family

## 2023-08-05 DIAGNOSIS — Z1231 Encounter for screening mammogram for malignant neoplasm of breast: Secondary | ICD-10-CM

## 2023-09-14 ENCOUNTER — Telehealth: Payer: Self-pay | Admitting: Acute Care

## 2023-09-14 NOTE — Telephone Encounter (Signed)
 Returned call from VM to schedule annual LDCT.  No answer. Left message

## 2023-09-28 ENCOUNTER — Ambulatory Visit (HOSPITAL_BASED_OUTPATIENT_CLINIC_OR_DEPARTMENT_OTHER)
Admission: RE | Admit: 2023-09-28 | Discharge: 2023-09-28 | Disposition: A | Discharge status: Home / Self Care | Source: Ambulatory Visit | Attending: Acute Care | Admitting: Acute Care

## 2023-09-28 DIAGNOSIS — Z87891 Personal history of nicotine dependence: Secondary | ICD-10-CM | POA: Insufficient documentation

## 2023-10-11 ENCOUNTER — Other Ambulatory Visit: Payer: Self-pay | Admitting: Family

## 2023-10-26 ENCOUNTER — Ambulatory Visit: Payer: BC Managed Care – PPO | Admitting: Family

## 2023-11-01 DIAGNOSIS — B0089 Other herpesviral infection: Secondary | ICD-10-CM | POA: Diagnosis not present

## 2023-11-01 DIAGNOSIS — D2272 Melanocytic nevi of left lower limb, including hip: Secondary | ICD-10-CM | POA: Diagnosis not present

## 2023-11-01 DIAGNOSIS — L821 Other seborrheic keratosis: Secondary | ICD-10-CM | POA: Diagnosis not present

## 2023-11-01 DIAGNOSIS — D485 Neoplasm of uncertain behavior of skin: Secondary | ICD-10-CM | POA: Diagnosis not present

## 2023-11-01 DIAGNOSIS — L814 Other melanin hyperpigmentation: Secondary | ICD-10-CM | POA: Diagnosis not present

## 2023-11-01 DIAGNOSIS — L570 Actinic keratosis: Secondary | ICD-10-CM | POA: Diagnosis not present

## 2023-11-20 DIAGNOSIS — T783XXA Angioneurotic edema, initial encounter: Secondary | ICD-10-CM | POA: Diagnosis not present

## 2023-11-20 DIAGNOSIS — L500 Allergic urticaria: Secondary | ICD-10-CM | POA: Diagnosis not present

## 2023-12-12 ENCOUNTER — Encounter: Payer: Self-pay | Admitting: Internal Medicine

## 2023-12-12 ENCOUNTER — Ambulatory Visit: Payer: BC Managed Care – PPO | Admitting: Internal Medicine

## 2023-12-12 VITALS — BP 110/70 | HR 59 | Ht 64.0 in | Wt 172.0 lb

## 2023-12-12 DIAGNOSIS — E041 Nontoxic single thyroid nodule: Secondary | ICD-10-CM | POA: Diagnosis not present

## 2023-12-12 DIAGNOSIS — E213 Hyperparathyroidism, unspecified: Secondary | ICD-10-CM | POA: Diagnosis not present

## 2023-12-12 DIAGNOSIS — E039 Hypothyroidism, unspecified: Secondary | ICD-10-CM | POA: Diagnosis not present

## 2023-12-12 NOTE — Progress Notes (Signed)
 Name: Melissa Gibson  MRN/ DOB: [id number], Jul 24, 1965    Age/ Sex: 58 y.o., female     PCP: Dorrene Gaucher, NP   Reason for Endocrinology Evaluation: Right thyroid  nodule      Initial Endocrinology Clinic Visit: 05/19/2017    PATIENT IDENTIFIER: Melissa Gibson is a 58 y.o., female with a past medical history of dyslipidemia and hypothyroidism. She has followed with [facility] since 05/19/2017 for consultative assistance with management of her hypothyroid/thyroid  nodule .   HISTORICAL SUMMARY: The patient was first diagnosed with hypercalcemia in 2019, with a serum max of 10.7 mg/dL in 10/979 ( non-corrected) . Her PTH level has been fluctuating between 38 - 84 pg/mL  She has not official diagnosis of renal stones  NO hx of bone fractures  DXA showed low bone density 10/2019   No FH of renal stones or hypercalcemia   Her labs have been normal since 2021  I am not sure if she truly had  true hypercalcemia, the first time with serum calcium  of 10.3 mg/dL , her albumin was 4.3 g/dL with a corrected calcium  of 10.06 mg/dL, which is normal. The second time she had an elevated serum calcium , there was no concomitant albumin . But repeat serum calcium  and ionized calcium  has been normal.      THYROID  HISTORY: She has been diagnosed with right thyroid  nodule since 2018. This has been stable .  She has been on levothyroxine  since 2016   SUBJECTIVE:     Today (12/12/2023):  [name] is here for a follow up on hypothyroidism  and MNG and hyperparathyroidism.    Weight has been trending down Denies local neck swelling  No constipation or   diarrhea  Stable occasional palpitations  Biotin -Yes  Has noted polydipsia but no frequency No falls recently      Vitamin D  1000 iu daily  Levothyroxine  112 mcg daily  2-3 servings of dietary calcium  daily   HISTORY:  Past Medical History:  Past Medical History:  Diagnosis Date   Abnormal Pap  smear of cervix    -age 20/unsure if she had any treatment/03-03-15 LSIL:Pos HR HPV;LEEP 04-14-15 LGSIL w/neg margins   CIN II (cervical intraepithelial neoplasia II) 03/05/2016   Constipation 10/07/2020   COVID 01/12/2021   HYPERLIPIDEMIA 10/08/2008   Qualifier: Diagnosis of  By: Rochelle Chu MD, Randa Burton.    Hyperparathyroidism (HCC) 10/22/2019   Hypothyroidism 10/08/2008   Qualifier: Diagnosis of  By: Rochelle Chu MD, Randa Burton.    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   LEEP  04/14/2015   CIN I/2   NOVASURE ABLATION  11/03/2010   TUBAL LIGATION  07/05/2004   Social History:  reports that she quit smoking about 15 years ago. Her smoking use included cigarettes. She started smoking about 45 years ago. She has a 30 pack-year smoking history. She has never used smokeless tobacco. She reports current alcohol use of about 1.0 - 2.0 standard drink of alcohol per week. She reports that she does not use drugs. Family History:  Family History  Problem Relation Age of Onset   COPD Mother  on hospice   Hyperlipidemia Mother    Heart attack Father    Hypertension Father    Heart failure Father    Hyperlipidemia Father    Lung cancer Sister 83       lung cancer   Infertility Sister    Thyroid  disease Sister    Thyroid  disease Sister        hypothyroid   Cancer Maternal Grandmother        lymph nodes   Arthritis Maternal Grandfather    Heart attack Brother 62   COPD Brother    Cirrhosis Brother        caused his death, ETOH abuse   Breast cancer Neg Hx    Hypercalcemia Neg Hx    BRCA 1/2 Neg Hx      HOME  MEDICATIONS: Allergies as of 12/12/2023   No Known Allergies      Medication List        Accurate as of December 12, 2023  7:40 AM. If you have any questions, ask your nurse or doctor.          amLODipine  2.5 MG tablet Commonly known as: NORVASC  Take 1 tablet by mouth daily.   atorvastatin  20 MG tablet Commonly known as: LIPITOR Take 1 tablet (20 mg total) by mouth daily.   levothyroxine  112 MCG tablet Commonly known as: SYNTHROID  Take 1 tablet (112 mcg total) by mouth daily.   valACYclovir  1000 MG tablet Commonly known as: Valtrex  2 tabs by mouth at start of cold sore.  Repeat in 12 hours   valACYclovir  500 MG tablet Commonly known as: VALTREX  Take 500 mg by mouth daily.          OBJECTIVE:   PHYSICAL EXAM: VS: BP 110/70 (BP Location: Left Arm, Patient Position: Sitting, Cuff Size: Normal)   Pulse (!) 59   Ht 5\' 4"  (1.626 m)   Wt 172 lb (78 kg)   LMP 04/17/2013 (Exact Date)   SpO2 99%   BMI 29.52 kg/m    EXAM: General: Pt appears well and is in NAD  Neck: General: Supple without adenopathy. Thyroid : Unable to appreciate right thyroid  nodule on today's exam  Lungs: Clear with good BS bilat  Heart: Auscultation: RRR.  Abdomen: soft, nontender  Extremities:  BL LE: No pretibial edema  Mental Status: Judgment, insight: Intact Orientation: Oriented to time, place, and person Mood and affect: No depression, anxiety, or agitation     DATA REVIEWED:   Latest Reference Range & Units 06/09/23 07:58  Sodium 135 - 146 mmol/L 140  Potassium 3.5 - 5.3 mmol/L 4.6  Chloride 98 - 110 mmol/L 101  CO2 20 - 32 mmol/L 31  Glucose 65 - 99 mg/dL 94  BUN 7 - 25 mg/dL 18  Creatinine 4.69 - 6.29 mg/dL 5.28  Calcium  8.6 - 10.4 mg/dL 9.8  BUN/Creatinine Ratio 6 - 22 (calc) SEE NOTE:    Latest Reference Range & Units 06/09/23 07:58  Vitamin D , 25-Hydroxy 30 - 100 ng/mL 51    Latest Reference Range & Units 06/09/23 07:58  TSH 0.40 - 4.50 mIU/L 2.23  T4,Free(Direct)  0.8 - 1.8 ng/dL 1.3  Albumin MSPROF 3.6 - 5.1 g/dL 4.3      Thyroid  Ultrasound 07/08/2022 Estimated total number of nodules >/= 1 cm: 1   Number of spongiform nodules >/=  2 cm not described below (TR1): 0   Number of mixed cystic and solid nodules >/= 1.5 cm not described below (TR2): 0  _________________________________________________________   Nodule labeled 1 is a small subcentimeter solid hyperechoic TR 3 nodule in the superior right thyroid  lobe measuring 0.7 cm. Given size (<1.4 cm) and appearance, this nodule does NOT meet TI-RADS criteria for biopsy or dedicated follow-up.   Nodule labeled 2 is a mixed cystic and solid isoechoic TR 2 nodule in the mid thyroid  lobe measuring 2.3 x 1.5 x 1.3 cm (see series 1, image 8). This nodule was previously described as a solid isoechoic nodule measuring up to 1.9 cm in October 2021. This nodule does NOT meet TI-RADS criteria for biopsy or dedicated follow-up.   IMPRESSION: 1. Heterogeneous, multinodular thyroid  gland. No new thyroid  nodules. 2. Nodule labeled 2 in the mid right thyroid  lobe demonstrates interval cystic changes, and as such has been downgraded from TR 3 to TR 2. Given these changes and 5 years total follow-up, this nodule can be considered benign and no longer meets criteria for further dedicated follow-up or biopsy.    ASSESSMENT / PLAN / RECOMMENDATIONS:   1. Hypothyroidism:  - Pt is clinically euthyroid  -Unable to proceed with TFT testing today as the patient is on biotin - Patient advised to hold biotin 2-3 days prior to any future thyroid  lab work   Medication Continue levothyroxine  112 mcg daily   2.  Hyperparathyroidism :  -Most likely secondary in nature -Historically she has had elevated serum calcium , the ones with a concomitant albumin after correction were normal. -PTH, vitamin D  and ionized calcium  have been normal through labs from 06/2023  Recommendations Consume 2-3 servings of  dietary calcium  daily Vitamin D3 1000 IU daily    3. MNG:   - Pt denies any local neck symptoms  -Last thyroid  ultrasound 07/2022 revealed more of a cystic component, and no further imaging was recommended after 5 years of follow-up, per radiology recommendation, but I did discuss with the patient that I would recommend repeating ultrasound around 2026, as despite the cystic component of the nodule it has increased in size from 1.9 cm to 2.3 cm   F/U in 6 months    Signed electronically by: Natale Bail, MD  [facility] End[facility]  [facility] [address] [place], [place] [address] Phone: [phone] FAX: [phone]      CC: Lynisha, Osuch, NP [address] [place] [address] Phone: [phone]  Fax: [phone]   Return to Endocrinology clinic as below: Future Appointments  Date Time Provider Department Center  12/12/2023  7:50 AM Chrislyn Seedorf, Julian Obey, MD [id number] None

## 2023-12-12 NOTE — Patient Instructions (Addendum)
 Hold Biotin 2-3 days before ay future thyroid  blood work

## 2023-12-16 ENCOUNTER — Other Ambulatory Visit

## 2023-12-16 DIAGNOSIS — E039 Hypothyroidism, unspecified: Secondary | ICD-10-CM | POA: Diagnosis not present

## 2023-12-16 DIAGNOSIS — E213 Hyperparathyroidism, unspecified: Secondary | ICD-10-CM | POA: Diagnosis not present

## 2023-12-17 LAB — VITAMIN D 25 HYDROXY (VIT D DEFICIENCY, FRACTURES): Vit D, 25-Hydroxy: 73 ng/mL (ref 30–100)

## 2023-12-17 LAB — PTH, INTACT AND CALCIUM
Calcium: 10 mg/dL (ref 8.6–10.4)
PTH: 67 pg/mL (ref 16–77)

## 2023-12-17 LAB — ALBUMIN: Albumin: 4.4 g/dL (ref 3.6–5.1)

## 2023-12-17 LAB — TSH: TSH: 2.82 m[IU]/L (ref 0.40–4.50)

## 2023-12-17 LAB — T4, FREE: Free T4: 1.4 ng/dL (ref 0.8–1.8)

## 2023-12-21 ENCOUNTER — Ambulatory Visit: Payer: Self-pay | Admitting: Internal Medicine

## 2024-03-08 DIAGNOSIS — J019 Acute sinusitis, unspecified: Secondary | ICD-10-CM | POA: Diagnosis not present

## 2024-03-08 DIAGNOSIS — R040 Epistaxis: Secondary | ICD-10-CM | POA: Diagnosis not present

## 2024-03-08 DIAGNOSIS — R519 Headache, unspecified: Secondary | ICD-10-CM | POA: Diagnosis not present

## 2024-04-20 ENCOUNTER — Encounter: Payer: Self-pay | Admitting: Family

## 2024-04-20 ENCOUNTER — Ambulatory Visit: Admitting: Family

## 2024-04-20 VITALS — BP 125/73 | HR 69 | Temp 98.0°F | Resp 16 | Ht 64.5 in | Wt 175.0 lb

## 2024-04-20 DIAGNOSIS — M160 Bilateral primary osteoarthritis of hip: Secondary | ICD-10-CM | POA: Diagnosis not present

## 2024-04-20 DIAGNOSIS — Z23 Encounter for immunization: Secondary | ICD-10-CM | POA: Diagnosis not present

## 2024-04-20 DIAGNOSIS — Z1211 Encounter for screening for malignant neoplasm of colon: Secondary | ICD-10-CM

## 2024-04-20 DIAGNOSIS — Z0000 Encounter for general adult medical examination without abnormal findings: Secondary | ICD-10-CM | POA: Diagnosis not present

## 2024-04-20 DIAGNOSIS — B001 Herpesviral vesicular dermatitis: Secondary | ICD-10-CM

## 2024-04-20 DIAGNOSIS — E213 Hyperparathyroidism, unspecified: Secondary | ICD-10-CM

## 2024-04-20 DIAGNOSIS — E039 Hypothyroidism, unspecified: Secondary | ICD-10-CM

## 2024-04-20 DIAGNOSIS — E785 Hyperlipidemia, unspecified: Secondary | ICD-10-CM

## 2024-04-20 LAB — LIPID PANEL
Cholesterol: 297 mg/dL — ABNORMAL HIGH (ref 0–200)
HDL: 66.5 mg/dL (ref >39.00)
LDL Cholesterol: 202 mg/dL — ABNORMAL HIGH (ref 0–99)
NonHDL: 230.17
Total CHOL/HDL Ratio: 4
Triglycerides: 143 mg/dL (ref 0.0–149.0)
VLDL: 28.6 mg/dL (ref 0.0–40.0)

## 2024-04-20 LAB — COMPREHENSIVE METABOLIC PANEL WITH GFR
ALT: 23 U/L (ref 0–35)
AST: 19 U/L (ref 0–37)
Albumin: 4.4 g/dL (ref 3.5–5.2)
Alkaline Phosphatase: 75 U/L (ref 39–117)
BUN: 17 mg/dL (ref 6–23)
CO2: 32 meq/L (ref 19–32)
Calcium: 9.6 mg/dL (ref 8.4–10.5)
Chloride: 100 meq/L (ref 96–112)
Creatinine, Ser: 0.85 mg/dL (ref 0.40–1.20)
GFR: 74.67 mL/min (ref >60.00)
Glucose, Bld: 78 mg/dL (ref 70–99)
Potassium: 4.6 meq/L (ref 3.5–5.1)
Sodium: 139 meq/L (ref 135–145)
Total Bilirubin: 0.7 mg/dL (ref 0.2–1.2)
Total Protein: 6.9 g/dL (ref 6.0–8.3)

## 2024-04-20 LAB — TSH: TSH: 11.59 u[IU]/mL — ABNORMAL HIGH (ref 0.35–5.50)

## 2024-04-20 MED ORDER — CELECOXIB 100 MG PO CAPS: 100.0000 mg | ORAL_CAPSULE | Freq: Two times a day (BID) | ORAL | 2 refills | Status: AC | PRN

## 2024-04-20 NOTE — Assessment & Plan Note (Addendum)
  Visit included discussion of ongoing health concerns and management plans. Discussed health maintenance, diet, exercise, vision, and dental exams. - Encourage regular exercise and healthy diet. - Discuss upcoming colonoscopy due January 2026. -Flu shot and Prevnar today  Osteopenia confirmed by bone density scan in March 2024. - Schedule bone density scan for March 2026.

## 2024-04-20 NOTE — Progress Notes (Signed)
 Subjective:     Patient ID: Melissa Gibson, female    DOB: August 23, 1965, 59 y.o.   MRN: [id number]  Chief Complaint  Patient presents with   Annual Exam    HPI  Discussed the use of AI scribe software for clinical note transcription with the patient, who gave verbal consent to proceed.  History of Present Illness  Melissa Gibson is a 58 year old female who presents with concerns about hip pain.  She experiences significant hip pain, particularly after sitting for more than 45 minutes, with associated cracking sounds. Discontinuation of statin medication led to slight improvement, but pain persists, especially when rising from a seated position. Her hands are extremely painful upon waking, though there is no pain during sleep. Current medications include Synthroid  and Valtrex  as needed. She maintains regular physical activity by walking and using stairs at work. She denies cough, cold symptoms, skin concerns, hearing or vision issues, leg swelling, digestive concerns, urinary issues, frequent headaches, or mental health concerns.  Immunizations: flu shot today Diet: fair Exercise: walks regularly Colonoscopy: due in January Dexa: due April Pap Smear: due 5/28- sees GYN Mammogram: 1/25 due 08/04/2024 Vision:  up to date Dental:  up to date      Health Maintenance Due  Topic Date Due   Pneumococcal Vaccine: 50+ Years (1 of 2 - PCV) Never done   Influenza Vaccine  02/03/2024   COVID-19 Vaccine (4 - 2025-26 season) 03/05/2024   Colonoscopy  07/08/2024    Past Medical History:  Diagnosis Date   Abnormal Pap smear of cervix    -age 22/unsure if she had any treatment/03-03-15 LSIL:Pos HR HPV;LEEP 04-14-15 LGSIL w/neg margins   CIN II (cervical intraepithelial neoplasia II) 03/05/2016   Constipation 10/07/2020   COVID 01/12/2021   HYPERLIPIDEMIA 10/08/2008   Qualifier: Diagnosis of  By: Joshua MD, Debby CROME.    Hyperparathyroidism 10/22/2019   Hypothyroidism  10/08/2008   Qualifier: Diagnosis of  By: Joshua MD, Debby CROME.    Lateral epicondylitis  of elbow 12/27/2011   Melasma 12/27/2011   Rectal bleeding 10/07/2020   Right thyroid  nodule 04/08/2020   Secondary hyperparathyroidism, non-renal 04/10/2020   Thyroid  nodule 05/19/2017    Past Surgical History:  Procedure Laterality Date   COLONOSCOPY W/ BIOPSIES  07/10/2014   Diverticulosis of large intestines and hyperplastic ployp X 1 recheck in 10 yrs.   LEEP  04/14/2015   CIN I/2   NOVASURE ABLATION  11/03/2010   TUBAL LIGATION  07/05/2004    Family History  Problem Relation Age of Onset   COPD Mother        on hospice   Hyperlipidemia Mother    Heart attack Father    Hypertension Father    Heart failure Father    Hyperlipidemia Father    Lung cancer Sister 109       lung cancer   Infertility Sister    Thyroid  disease Sister    Thyroid  disease Sister        hypothyroid   Cancer Maternal Grandmother        lymph nodes   Arthritis Maternal Grandfather    Heart attack Brother 7   COPD Brother    Cirrhosis Brother        caused his death, ETOH abuse   Breast cancer Neg Hx    Hypercalcemia Neg Hx    BRCA 1/2 Neg Hx     Social History   Socioeconomic History   Marital status: Married  Spouse name: Not on file   Number of children: Not on file   Years of education: Not on file   Highest education level: Some college, no degree  Occupational History   Occupation: SALES  Tobacco Use   Smoking status: Former    Current packs/day: 0.00    Average packs/day: 1 pack/day for 30.0 years (30.0 ttl pk-yrs)    Types: Cigarettes    Start date: 07/05/1978    Quit date: 07/05/2008    Years since quitting: 15.8   Smokeless tobacco: Never   Tobacco comments:    quit in 2010  Substance and Sexual Activity   Alcohol use: Yes    Alcohol/week: 1.0 - 2.0 standard drink of alcohol    Types: 1 - 2 Standard drinks or equivalent per week   Drug use: No   Sexual activity: Yes     Partners: Male    Birth control/protection: Surgical    Comment: BTL, Ablation  Other Topics Concern   Not on file  Social History Narrative   Regular Exercise    Former smoker   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   Married   No children   1 year of college   Enjoys crafts, Presenter, broadcasting.   Social Drivers of Corporate investment banker Strain: Low Risk  (04/21/2023)   Overall Financial Resource Strain (CARDIA)    Difficulty of Paying Living Expenses: Not very hard  Food Insecurity: No Food Insecurity (04/21/2023)   Hunger Vital Sign    Worried About Running Out of Food in the Last Year: Never true    Ran Out of Food in the Last Year: Never true  Transportation Needs: No Transportation Needs (04/21/2023)   PRAPARE - Administrator, Civil Service (Medical): No    Lack of Transportation (Non-Medical): No  Physical Activity: Insufficiently Active (04/21/2023)   Exercise Vital Sign    Days of Exercise per Week: 3 days    Minutes of Exercise per Session: 20 min  Stress: No Stress Concern Present (04/21/2023)   [employer] of Occupational Health - Occupational Stress Questionnaire    Feeling of Stress : Only a little  Social Connections: Moderately Integrated (04/21/2023)   Social Connection and Isolation Panel    Frequency of Communication with Friends and Family: Twice a week    Frequency of Social Gatherings with Friends and Family: Once a week    Attends Religious Services: 1 to 4 times per year    Active Member of [employer] or Organizations: No    Attends Engineer, structural: Not on file    Marital Status: Married  Catering manager Violence: Not on file    Outpatient Medications Prior to Visit  Medication Sig Dispense Refill   amLODipine  (NORVASC ) 2.5 MG tablet Take 1 tablet by mouth daily. 90 tablet 0   levothyroxine  (SYNTHROID ) 112 MCG tablet Take 1 tablet (112 mcg total) by mouth daily. 90 tablet 3   valACYclovir  (VALTREX ) 1000 MG tablet 2 tabs by mouth  at start of cold sore.  Repeat in 12 hours 20 tablet 1   valACYclovir  (VALTREX ) 500 MG tablet Take 500 mg by mouth daily.     atorvastatin  (LIPITOR) 20 MG tablet Take 1 tablet (20 mg total) by mouth daily. (Patient not taking: Reported on 04/20/2024) 90 tablet 1   No facility-administered medications prior to visit.    No Known Allergies  Review of Systems  Constitutional:  Negative for weight loss.  HENT:  Negative  for congestion and hearing loss.   Eyes:  Negative for blurred vision.  Respiratory:  Negative for cough.   Cardiovascular:  Negative for leg swelling.  Gastrointestinal:  Negative for constipation and diarrhea.  Genitourinary:  Negative for dysuria and frequency.  Musculoskeletal:  Positive for joint pain.  Skin:  Negative for rash.  Neurological:  Negative for headaches.  Psychiatric/Behavioral:  Negative for depression. The patient is not nervous/anxious.        Objective:    Physical Exam   BP 125/73 (BP Location: Right Arm, Patient Position: Sitting)   Pulse 69   Temp 98 F (36.7 C) (Oral)   Resp 16   Ht 5' 4.5 (1.638 m)   Wt 175 lb (79.4 kg)   LMP 04/17/2013 (Exact Date)   SpO2 99%   BMI 29.57 kg/m  Wt Readings from Last 3 Encounters:  04/20/24 175 lb (79.4 kg)  12/12/23 172 lb (78 kg)  06/09/23 177 lb (80.3 kg)  Physical Exam  Constitutional: She is oriented to person, place, and time. She appears well-developed and well-nourished. No distress.  HENT:  Head: Normocephalic and atraumatic.  Right Ear: Tympanic membrane and ear canal normal.  Left Ear: Tympanic membrane and ear canal normal.  Mouth/Throat: Oropharynx is clear and moist.  Eyes: Pupils are equal, round, and reactive to light. No scleral icterus.  Neck: Normal range of motion. Thyromegaly present.  Cardiovascular: Normal rate and regular rhythm.   No murmur heard. Pulmonary/Chest: Effort normal and breath sounds normal. No respiratory distress. He has no wheezes. She has no rales.  She exhibits no tenderness.  Abdominal: Soft. Bowel sounds are normal. She exhibits no distension and no mass. There is no tenderness. There is no rebound and no guarding.  Musculoskeletal: She exhibits no edema.  Lymphadenopathy:    She has no cervical adenopathy.  Neurological: She is alert and oriented to person, place, and time. She has normal patellar reflexes. She exhibits normal muscle tone. Coordination normal.  Skin: Skin is warm and dry.  Psychiatric: She has a normal mood and affect. Her behavior is normal. Judgment and thought content normal.  Breast/Pelvic: deferred         Assessment & Plan:        Assessment & Plan:   Problem List Items Addressed This Visit       Unprioritized   Recurrent cold sores   Stable- keeps valtrex  on hand for prn use.       Preventative health care - Primary    Visit included discussion of ongoing health concerns and management plans. Discussed health maintenance, diet, exercise, vision, and dental exams. - Encourage regular exercise and healthy diet. - Discuss upcoming colonoscopy due January 2026. -Flu shot and Prevnar today  Osteopenia confirmed by bone density scan in March 2024. - Schedule bone density scan for March 2026.      Hypothyroidism   Update TSH, continues synthroid .       Relevant Orders   TSH   Hyperparathyroidism   Management per Endocrinology.      Hyperlipidemia   Off statin, but joint pain persists.  Recommend repeat lipids, if still elevated she is agreeable to restarting statin.       Relevant Orders   Lipid panel   Comp Met (CMET)   Other Visit Diagnoses       Needs flu shot       Relevant Orders   Flu vaccine trivalent PF, 6mos and older(Flulaval,Afluria,Fluarix,Fluzone)     Osteoarthritis  of both hips, unspecified osteoarthritis type       Relevant Medications   celecoxib  (CELEBREX ) 100 MG capsule     Screening for colon cancer       Relevant Orders   Ambulatory referral to  Gastroenterology       I am having Cleopha A. Floretta start on celecoxib . I am also having her maintain her valACYclovir , levothyroxine , atorvastatin , amLODipine , and valACYclovir .  Meds ordered this encounter  Medications   celecoxib  (CELEBREX ) 100 MG capsule    Sig: Take 1 capsule (100 mg total) by mouth 2 (two) times daily as needed.    Dispense:  60 capsule    Refill:  2    Supervising Provider:   DOMENICA BLACKBIRD A [[id number]]

## 2024-04-20 NOTE — Assessment & Plan Note (Signed)
 Off statin, but joint pain persists.  Recommend repeat lipids, if still elevated she is agreeable to restarting statin.

## 2024-04-20 NOTE — Assessment & Plan Note (Signed)
 Update TSH, continues synthroid .

## 2024-04-20 NOTE — Patient Instructions (Signed)
 VISIT SUMMARY:  Today, you were seen for concerns about hip and hand pain, likely due to osteoarthritis. We also discussed your osteopenia, hyperlipidemia, hypothyroidism, and general health maintenance, including immunizations.  YOUR PLAN:  OSTEOARTHRITIS WITH HIP AND HAND PAIN: You have chronic hip and hand pain, likely due to osteoarthritis. Stopping your statin medication provided some improvement, but pain persists. -Prescribe Celebrex  as needed for pain. -Recommend Tylenol on less severe days. -Consider referral to orthopedics if pain persists.  OSTEOPENIA: You have osteopenia confirmed by a bone density scan in March 2024. -Schedule a bone density scan for March 2026.  HYPERLIPIDEMIA: You stopped taking Lipitor due to suspected contribution to hip pain. We will reassess your lipid levels to consider resuming statin therapy. -Order a lipid panel. -Discuss potential resumption of Lipitor based on lipid panel results.  HYPOTHYROIDISM: Your hypothyroidism is managed by Dr. Shelvia. We discussed checking your TSH levels. -Order a TSH test.  ADULT WELLNESS VISIT: We discussed your ongoing health concerns and management plans, including diet, exercise, vision, and dental exams. -Encourage regular exercise and a healthy diet. -Discuss upcoming colonoscopy due January 2026.  IMMUNIZATIONS: We discussed and updated your immunizations. -Administer flu shot. -Administer Prevnar 20 pneumonia vaccine.

## 2024-04-20 NOTE — Assessment & Plan Note (Signed)
 Management per Endocrinology.

## 2024-04-20 NOTE — Assessment & Plan Note (Signed)
 Stable- keeps valtrex  on hand for prn use.

## 2024-04-22 ENCOUNTER — Other Ambulatory Visit: Payer: Self-pay | Admitting: Family

## 2024-04-22 ENCOUNTER — Encounter: Payer: Self-pay | Admitting: Family

## 2024-04-22 MED ORDER — ATORVASTATIN CALCIUM 20 MG PO TABS: 20.0000 mg | ORAL_TABLET | Freq: Every day | ORAL | 1 refills | Status: AC

## 2024-05-30 ENCOUNTER — Other Ambulatory Visit: Payer: Self-pay | Admitting: Internal Medicine

## 2024-05-30 DIAGNOSIS — E039 Hypothyroidism, unspecified: Secondary | ICD-10-CM

## 2024-06-11 NOTE — Progress Notes (Deleted)
 Name: Melissa Gibson  MRN/ DOB: [id number], 12/24/1965    Age/ Sex: 58 y.o., female     PCP: Daryl Setter, NP   Reason for Endocrinology Evaluation: Right thyroid  nodule      Initial Endocrinology Clinic Visit: 05/19/2017    PATIENT IDENTIFIER: Melissa Gibson is a 58 y.o., female with a past medical history of dyslipidemia and hypothyroidism. She has followed with [facility] since 05/19/2017 for consultative assistance with management of her hypothyroid/thyroid  nodule .   HISTORICAL SUMMARY: The patient was first diagnosed with hypercalcemia in 2019, with a serum max of 10.7 mg/dL in 02/7977 ( non-corrected) . Her PTH level has been fluctuating between 38 - 84 pg/mL  She has not official diagnosis of renal stones  NO hx of bone fractures  DXA showed low bone density 10/2019   No FH of renal stones or hypercalcemia   Her labs have been normal since 2021  I am not sure if she truly had  true hypercalcemia, the first time with serum calcium  of 10.3 mg/dL , her albumin was 4.3 g/dL with a corrected calcium  of 10.06 mg/dL, which is normal. The second time she had an elevated serum calcium , there was no concomitant albumin . But repeat serum calcium  and ionized calcium  has been normal.      THYROID  HISTORY: She has been diagnosed with right thyroid  nodule since 2018. This has been stable .  She has been on levothyroxine  since 2016   SUBJECTIVE:     Today (06/11/2024):  [name] is here for a follow up on hypothyroidism  and MNG and hyperparathyroidism.    Weight has been trending down Denies local neck swelling  No constipation or   diarrhea  Stable occasional palpitations  Biotin -Yes  Has noted polydipsia but no frequency No falls recently      Vitamin D  1000 iu daily  Levothyroxine  112 mcg daily  2-3 servings of dietary calcium  daily   HISTORY:  Past Medical History:  Past Medical History:  Diagnosis Date   Abnormal Pap  smear of cervix    -age 58/unsure if she had any treatment/03-03-15 LSIL:Pos HR HPV;LEEP 04-14-15 LGSIL w/neg margins   CIN II (cervical intraepithelial neoplasia II) 03/05/2016   Constipation 10/07/2020   COVID 01/12/2021   HYPERLIPIDEMIA 10/08/2008   Qualifier: Diagnosis of  By: Joshua MD, Debby CROME.    Hyperparathyroidism 10/22/2019   Hypothyroidism 10/08/2008   Qualifier: Diagnosis of  By: Joshua MD, Debby CROME.    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   LEEP  04/14/2015   CIN I/2   NOVASURE ABLATION  11/03/2010   TUBAL LIGATION  07/05/2004   Social History:  reports that she quit smoking about 15 years ago. Her smoking use included cigarettes. She started smoking about 45 years ago. She has a 30 pack-year smoking history. She has never used smokeless tobacco. She reports current alcohol use of about 1.0 - 2.0 standard drink of alcohol per week. She reports that she does not use drugs. Family History:  Family History  Problem Relation Age of Onset   COPD Mother  on hospice   Hyperlipidemia Mother    Heart attack Father    Hypertension Father    Heart failure Father    Hyperlipidemia Father    Lung cancer Sister 34       lung cancer   Infertility Sister    Thyroid  disease Sister    Thyroid  disease Sister        hypothyroid   Cancer Maternal Grandmother        lymph nodes   Arthritis Maternal Grandfather    Heart attack Brother 84   COPD Brother    Cirrhosis Brother        caused his death, ETOH abuse   Breast cancer Neg Hx    Hypercalcemia Neg Hx    BRCA 1/2 Neg Hx      HOME  MEDICATIONS: Allergies as of 06/12/2024   No Known Allergies      Medication List        Accurate as of June 11, 2024 11:12 AM. If you have any questions, ask your nurse or doctor.          amLODipine  2.5 MG tablet Commonly known as: NORVASC  Take 1 tablet by mouth daily.   atorvastatin  20 MG tablet Commonly known as: LIPITOR Take 1 tablet (20 mg total) by mouth daily.   celecoxib  100 MG capsule Commonly known as: CeleBREX  Take 1 capsule (100 mg total) by mouth 2 (two) times daily as needed.   levothyroxine  112 MCG tablet Commonly known as: SYNTHROID  Take 1 tablet by mouth daily.   valACYclovir  1000 MG tablet Commonly known as: Valtrex  2 tabs by mouth at start of cold sore.  Repeat in 12 hours   valACYclovir  500 MG tablet Commonly known as: VALTREX  Take 500 mg by mouth daily.          OBJECTIVE:   PHYSICAL EXAM: VS: LMP 04/17/2013 (Exact Date)    EXAM: General: Pt appears well and is in NAD  Neck: General: Supple without adenopathy. Thyroid : Unable to appreciate right thyroid  nodule on today's exam  Lungs: Clear with good BS bilat  Heart: Auscultation: RRR.  Abdomen: soft, nontender  Extremities:  BL LE: No pretibial edema  Mental Status: Judgment, insight: Intact Orientation: Oriented to time, place, and person Mood and affect: No depression, anxiety, or agitation     DATA REVIEWED:   Latest Reference Range & Units 06/09/23 07:58  Sodium 135 - 146 mmol/L 140  Potassium 3.5 - 5.3 mmol/L 4.6  Chloride 98 - 110 mmol/L 101  CO2 20 - 32 mmol/L 31  Glucose 65 - 99 mg/dL 94  BUN 7 - 25 mg/dL 18  Creatinine 9.49 - 8.96 mg/dL 9.14  Calcium  8.6 - 10.4 mg/dL 9.8  BUN/Creatinine Ratio 6 - 22 (calc) SEE NOTE:    Latest Reference Range & Units 06/09/23 07:58  Vitamin D , 25-Hydroxy 30 - 100 ng/mL 51    Latest Reference Range & Units 06/09/23 07:58  TSH 0.40 - 4.50 mIU/L 2.23  T4,Free(Direct) 0.8 - 1.8 ng/dL 1.3  Albumin MSPROF 3.6 - 5.1 g/dL 4.3       Thyroid  Ultrasound 07/08/2022    Estimated total number of nodules >/= 1 cm: 1   Number of spongiform nodules >/=  2 cm not described below (TR1): 0   Number of mixed cystic and solid nodules >/= 1.5 cm not described below (TR2): 0   _________________________________________________________   Nodule labeled 1 is a small subcentimeter solid hyperechoic TR 3 nodule in the superior right  thyroid  lobe measuring 0.7 cm. Given size (<1.4 cm) and appearance, this nodule does NOT meet TI-RADS criteria for biopsy or dedicated follow-up.   Nodule labeled 2 is a mixed cystic and solid isoechoic TR 2 nodule in the mid thyroid  lobe measuring 2.3 x 1.5 x 1.3 cm (see series 1, image 8). This nodule was previously described as a solid isoechoic nodule measuring up to 1.9 cm in October 2021. This nodule does NOT meet TI-RADS criteria for biopsy or dedicated follow-up.   IMPRESSION: 1. Heterogeneous, multinodular thyroid  gland. No new thyroid  nodules. 2. Nodule labeled 2 in the mid right thyroid  lobe demonstrates interval cystic changes, and as such has been downgraded from TR 3 to TR 2. Given these changes and 5 years total follow-up, this nodule can be considered benign and no longer meets criteria for further dedicated follow-up or biopsy.    ASSESSMENT / PLAN / RECOMMENDATIONS:   1. Hypothyroidism:  - Pt is clinically euthyroid  -Unable to proceed with TFT testing today as the patient is on biotin - Patient advised to hold biotin 2-3 days prior to any future thyroid  lab work   Medication Continue levothyroxine  112 mcg daily   2.  Hyperparathyroidism :  -Most likely secondary in nature -Historically she has had elevated serum calcium , the ones with a concomitant albumin after correction were normal. -PTH, vitamin D  and ionized calcium  have been normal through labs from 06/2023  Recommendations Consume 2-3 servings of dietary calcium  daily Vitamin D3 1000 IU  daily    3. MNG:   - Pt denies any local neck symptoms  -Last thyroid  ultrasound 07/2022 revealed more of a cystic component, and no further imaging was recommended after 5 years of follow-up, per radiology recommendation, but I did discuss with the patient that I would recommend repeating ultrasound around 2026, as despite the cystic component of the nodule it has increased in size from 1.9 cm to 2.3 cm   F/U in 6 months    Signed electronically by: Stefano Redgie Butts, MD  [facility]  [facility] [address] [place], [place] [address] Phone: [phone] FAX: [phone]      CC: Ionna, Avis, NP [address] [place] [address] Phone: [phone]  Fax: [phone]   Return to Endocrinology clinic as below: Future Appointments  Date Time Provider Department Center  06/12/2024  8:10 AM Lyzette Reinhardt, Donell Redgie, MD [id number] None

## 2024-06-12 ENCOUNTER — Ambulatory Visit: Admitting: Internal Medicine

## 2024-07-11 ENCOUNTER — Other Ambulatory Visit: Payer: Self-pay | Admitting: Family

## 2024-07-16 ENCOUNTER — Other Ambulatory Visit: Payer: Self-pay | Admitting: Family

## 2024-07-23 LAB — HM COLONOSCOPY

## 2024-08-08 ENCOUNTER — Other Ambulatory Visit: Payer: Self-pay | Admitting: Family

## 2024-08-08 DIAGNOSIS — Z1231 Encounter for screening mammogram for malignant neoplasm of breast: Secondary | ICD-10-CM

## 2024-08-10 ENCOUNTER — Inpatient Hospital Stay: Admission: RE | Admit: 2024-08-10

## 2024-08-10 DIAGNOSIS — Z1231 Encounter for screening mammogram for malignant neoplasm of breast: Secondary | ICD-10-CM
